# Patient Record
Sex: Male | Born: 1990 | Race: White | Hispanic: No | Marital: Single | Smoking: Former smoker
Health system: Southern US, Community
[De-identification: ages and names within clinical notes are randomized; demographics above are authoritative.]

## PROBLEM LIST (undated history)

## (undated) DIAGNOSIS — Z9189 Other specified personal risk factors, not elsewhere classified: Secondary | ICD-10-CM

## (undated) DIAGNOSIS — Q205 Discordant atrioventricular connection: Secondary | ICD-10-CM

## (undated) DIAGNOSIS — Z87891 Personal history of nicotine dependence: Secondary | ICD-10-CM

## (undated) DIAGNOSIS — Z789 Other specified health status: Secondary | ICD-10-CM

## (undated) DIAGNOSIS — Z7289 Other problems related to lifestyle: Secondary | ICD-10-CM

## (undated) DIAGNOSIS — F1221 Cannabis dependence, in remission: Secondary | ICD-10-CM

## (undated) HISTORY — DX: Other specified personal risk factors, not elsewhere classified: Z91.89

## (undated) HISTORY — DX: Cannabis dependence, in remission: F12.21

## (undated) HISTORY — DX: Discordant atrioventricular connection: Q20.5

## (undated) HISTORY — DX: Personal history of nicotine dependence: Z87.891

## (undated) HISTORY — DX: Other problems related to lifestyle: Z72.89

## (undated) HISTORY — DX: Other specified health status: Z78.9

## (undated) HISTORY — PX: TRANSPOSITION OF GREAT VESSELS REPAIR: SHX815

---
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
## 2012-01-03 HISTORY — PX: OTHER SURGICAL HISTORY: SHX169

## 2012-01-27 ENCOUNTER — Ambulatory Visit: Payer: Self-pay | Admitting: Family Medicine

## 2012-01-27 ENCOUNTER — Ambulatory Visit (INDEPENDENT_AMBULATORY_CARE_PROVIDER_SITE_OTHER): Payer: BC Managed Care – PPO | Admitting: Family Medicine

## 2012-01-27 ENCOUNTER — Encounter: Payer: Self-pay | Admitting: Family Medicine

## 2012-01-27 ENCOUNTER — Telehealth: Payer: Self-pay | Admitting: *Deleted

## 2012-01-27 VITALS — BP 118/70 | HR 64 | Temp 97.8°F | Wt 183.5 lb

## 2012-01-27 DIAGNOSIS — N509 Disorder of male genital organs, unspecified: Secondary | ICD-10-CM

## 2012-01-27 DIAGNOSIS — N5082 Scrotal pain: Secondary | ICD-10-CM | POA: Insufficient documentation

## 2012-01-27 LAB — POCT URINALYSIS DIPSTICK
Glucose, UA: NEGATIVE
Leukocytes, UA: NEGATIVE
Nitrite, UA: NEGATIVE
Spec Grav, UA: 1.02
Urobilinogen, UA: 0.2

## 2012-01-27 NOTE — Progress Notes (Signed)
Subjective:    [name], male    DOB: Mar 21, 1991, 21 y.o.   MRN: [id number]  HPI CC: testicle soreness  Danny Russo is 21 yo with h/o transposition of vessels.  1 1/2 mo h/o one testicle more tender than other.  Described as soreness ache.  Then recently noticed looks different.  Denies injury.    Currently sexually active.  1 partner in last year.  No STDs.  Last checked 05/2011.  No dysuria, urgency, frequency, discharge.  Wt Readings from Last 3 Encounters:  01/27/12 183 lb 8 oz (83.235 kg) (86.91%*)  07/02/11 153 lb 8 oz (69.627 kg) (59.50%*)  05/21/11 156 lb (70.761 kg) (63.99%*)   * Growth percentiles are based on CDC 2-20 Years data.   Working at [employer].  Has gained 30 lbs over winter.  Not as active as should be.  No chest pain, shortness of breath, leg swelling.    Quit smoking MJ and cigarettes.  Medications and allergies reviewed and updated in chart.  Past histories reviewed and updated if relevant as below. Patient Active Problem List  Diagnoses  . Healthcare maintenance  . Screening for venereal disease  . Snake bite  . Transposition of great arteries, corrected  . Scabies   Past Medical History  Diagnosis Date  . 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 Transposition of great arteries, corrected     infant, Dr. Elizebeth Brooking, no need for SBE ppx, 05/2011 last check WNL, rtc 2 yrs  . Marijuana abuse   . Smoking   . Alcohol use    Past Surgical History  Procedure Date  . Transposition of great vessels repair     infant  . Holter 06/2011    rare PAC, PVC  . US echocardiography 05/2011    normal bi vent size/fxn, normal flow across all valves, subsystemic R vent pressure, patent aortic arch, no pericardial effusion   History  Substance Use Topics  . Smoking status: Former Smoker -- 1.0 packs/day for 2 years    Types: Cigarettes    Quit date: 05/03/2011  . Smokeless tobacco: Never Used  . Alcohol Use:  Yes     Occasional, wine   Family History  Problem Relation Age of Onset  . Diabetes Maternal Grandmother   . Coronary artery disease Maternal Uncle 60  . Cancer Maternal Uncle     pancreatic cancer  . Stroke Neg Hx    No Known Allergies No current outpatient prescriptions on file prior to visit.   Review of Systems Per HPI    Objective:   Physical Exam  Nursing note and vitals reviewed. Constitutional: He appears well-developed and well-nourished. No distress.  HENT:  Head: Normocephalic and atraumatic.  Mouth/Throat: Oropharynx is clear and moist. No oropharyngeal exudate.  Eyes: Conjunctivae and EOM are normal. Pupils are equal, round, and reactive to light. No scleral icterus.  Cardiovascular: Normal rate, regular rhythm and intact distal pulses.   Murmur heard. Pulmonary/Chest: Effort normal and breath sounds normal. No respiratory distress. He has no wheezes. He has no rales.  Abdominal: Hernia confirmed negative in the right inguinal area and confirmed negative in the left inguinal area.  Genitourinary: Penis normal. Right testis shows no mass, no swelling and no tenderness. Right testis is descended. Left testis shows swelling and tenderness. Left testis shows no mass. Left testis is descended.       Left epididymis tender and swollen compared to right.  No mass on testicle appreciated. 3 resolving pustules on shaft of penis Shaves pubic hair.  Musculoskeletal: He exhibits no edema.  Lymphadenopathy:       Right: No inguinal adenopathy present.       Left: No inguinal adenopathy present.  Skin: Skin is warm and dry. No rash noted.  Psychiatric: He has a normal mood and affect.       Assessment & Plan:

## 2012-01-27 NOTE — Telephone Encounter (Signed)
Spoke with pt- discussed epididymal cyst o/w normal ultrasound.  To notify me if soreness not improving with NSAIDs for abx course (doxy).  O/w continue to monitor for now.

## 2012-01-27 NOTE — Telephone Encounter (Signed)
Angie from [facility] [place] called report:  Negative for torsion and otherwise normal/negative.  I advised we would call patient with results and that he was free to leave [facility].

## 2012-01-27 NOTE — Progress Notes (Signed)
Addended by: Josph Macho A on: 01/27/2012 09:17 AM   Modules accepted: Orders

## 2012-01-27 NOTE — Progress Notes (Signed)
Addended by: Eustaquio Boyden on: 01/27/2012 09:14 AM   Modules accepted: Orders

## 2012-01-27 NOTE — Patient Instructions (Signed)
We will call you with results of ultrasound at [phone]. I think you have inflammed epididymis on left side. We will see what ultrasound shows. Good to see you today, call us with questions. Work on getting more exercise - you have gained 30 lbs since last visit  Epididymitis Epididymitis is a swelling (inflammation) of the epididymis. The epididymis is a cord-like structure along the back part of the testicle. Epididymitis is usually, but not always, caused by infection. This is usually a sudden problem beginning with chills, fever and pain behind the scrotum and in the testicle. There may be swelling and redness of the testicle. DIAGNOSIS  Physical examination will reveal a tender, swollen epididymis. Sometimes, cultures are obtained from the urine or from prostate secretions to help find out if there is an infection or if the cause is a different problem. Sometimes, blood work is performed to see if your white blood cell count is elevated and if a germ (bacterial) or viral infection is present. Using this knowledge, an appropriate medicine which kills germs (antibiotic) can be chosen by your caregiver. A viral infection causing epididymitis will most often go away (resolve) without treatment. HOME CARE INSTRUCTIONS   Hot sitz baths for 20 minutes, 4 times per day, may help relieve pain.   Only take over-the-counter or prescription medicines for pain, discomfort or fever as directed by your caregiver.   Take all medicines, including antibiotics, as directed. Take the antibiotics for the full prescribed length of time even if you are feeling better.   It is very important to keep all follow-up appointments.  SEEK IMMEDIATE MEDICAL CARE IF:   You have a fever.   You have pain not relieved with medicines.   You have any worsening of your problems.   Your pain seems to come and go.   You develop pain, redness, and swelling in the scrotum and surrounding areas.  MAKE SURE YOU:    Understand these instructions.   Will watch your condition.   Will get help right away if you are not doing well or get worse.  Document Released: 11/15/2000 Document Revised: 07/31/2011 Document Reviewed: 10/05/2009 [facility] Patient Information 2012 [place], [place].

## 2012-01-27 NOTE — Assessment & Plan Note (Signed)
Anticipate epididymitis. Schedule [place] to r/o other cause and eval L epididymis. Anticipate congestive epididymitis. Treat with elevation of scrotum and NSAIDs. Consider abx - await [place] results. Declines STD testing, states monogamous relationship since last STD check 05/2011.

## 2012-01-28 LAB — URINE CULTURE
Colony Count: NO GROWTH
Organism ID, Bacteria: NO GROWTH

## 2012-03-31 ENCOUNTER — Encounter: Payer: Self-pay | Admitting: Family Medicine

## 2012-03-31 ENCOUNTER — Ambulatory Visit (INDEPENDENT_AMBULATORY_CARE_PROVIDER_SITE_OTHER): Payer: BC Managed Care – PPO | Admitting: Family Medicine

## 2012-03-31 ENCOUNTER — Encounter: Payer: Self-pay | Admitting: *Deleted

## 2012-03-31 VITALS — BP 102/60 | HR 74 | Temp 97.3°F | Wt 181.0 lb

## 2012-03-31 DIAGNOSIS — R0989 Other specified symptoms and signs involving the circulatory and respiratory systems: Secondary | ICD-10-CM

## 2012-03-31 DIAGNOSIS — R0600 Dyspnea, unspecified: Secondary | ICD-10-CM | POA: Insufficient documentation

## 2012-03-31 DIAGNOSIS — R0602 Shortness of breath: Secondary | ICD-10-CM | POA: Insufficient documentation

## 2012-03-31 MED ORDER — CETIRIZINE HCL 10 MG PO TABS: 10.0000 mg | ORAL_TABLET | Freq: Every day | ORAL | Status: DC

## 2012-03-31 NOTE — Patient Instructions (Signed)
I wonder if this is due to allergies - start claritin or zyrtec daily for next 1-2 weeks. If not improvement, let me know for course of albuterol. Back off nicotine. Update [place] if any worsening or cough, or fever or worsening pressure sensation or other concerns.

## 2012-03-31 NOTE — Assessment & Plan Note (Signed)
With allergic rhinitis sxs, also h/o using bronchodilator in past which helped. rec trial of antihistamine. To update me if sxs not improved with antihistamine, will start albuterol inhaler. If not improving, consider referral back to Dr. Elizebeth Brooking however doubt cardiac cause.

## 2012-03-31 NOTE — Progress Notes (Signed)
  Subjective:    [name], male    DOB: 07/26/1991, 21 y.o.   MRN: [id number]  HPI CC: trouble breathing  Danny Russo is pleasant 21 yo with h/o transposition of great arteries corrected as infant who presents with several day history of pressure in chest as well as trouble taking deep breath.  Otherwise no dyspnea.  Has been present for last 1 1/2 wks, but becoming more noticeable over last few days.  No diaphoresis or nausea.  No palpitations.  No dizziness, lightheadedness.  Recently increased stress from job.  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  Similar sxs last year, wonders if due to allergies.  ++ sneezing increased recently.  Mild RN and PNdrainage.  No congestion, HA, watery itchy eyes, no coughing, wheezing.  No h/o asthma.  Tried benadryl last night which may have helped sxs.  Started dipping more recently (over last 1-2 wks) and occasionally drinks EtOH.  No rec drugs, specifically no MJ.  No cigarettes.  Wt Readings from Last 3 Encounters:  03/31/12 181 lb (82.101 kg) (84.85%*)  01/27/12 183 lb 8 oz (83.235 kg) (86.91%*)  07/02/11 153 lb 8 oz (69.627 kg) (59.50%*)   * Growth percentiles are based on CDC 2-20 Years data.    Past Medical History  Diagnosis Date  . 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 Transposition of great arteries, corrected     infant, Dr. Elizebeth Brooking, no need for SBE ppx, 05/2011 last check WNL, rtc 2 yrs  . Marijuana smoker in remission   . History of smoking   . Alcohol use     Review of Systems Per HPI    Objective:   Physical Exam  Nursing note and vitals reviewed. Constitutional: He appears well-developed and well-nourished. No distress.  HENT:  Head: Normocephalic and atraumatic.  Right Ear: Hearing, tympanic membrane, external ear and ear canal normal.  Left Ear: Hearing, tympanic  membrane, external ear and ear canal normal.  Nose: Mucosal edema present. No rhinorrhea. Right sinus exhibits no maxillary sinus tenderness and no frontal sinus tenderness. Left sinus exhibits no maxillary sinus tenderness and no frontal sinus tenderness.  Mouth/Throat: Uvula is midline and mucous membranes are normal. Posterior oropharyngeal erythema present. No oropharyngeal exudate, posterior oropharyngeal edema or tonsillar abscesses.       Oropharyngeal cobblestoning  Eyes: Conjunctivae and EOM are normal. Pupils are equal, round, and reactive to light. No scleral icterus.  Neck: Normal range of motion. Neck supple.  Cardiovascular: Normal rate, regular rhythm and intact distal pulses.   Murmur (4/6 SEM) heard. Pulmonary/Chest: Effort normal. No respiratory distress. He has no wheezes. He has no rales.       Slight rhonchi left lobe but good air movement  Lymphadenopathy:    He has no cervical adenopathy.  Skin: Skin is warm and dry. No rash noted.       Assessment & Plan:

## 2012-04-01 ENCOUNTER — Ambulatory Visit: Payer: BC Managed Care – PPO | Admitting: Family Medicine

## 2012-05-06 ENCOUNTER — Telehealth: Payer: Self-pay | Admitting: Family Medicine

## 2012-05-06 NOTE — Telephone Encounter (Signed)
Caller: Danny Russo/Patient; PCP: Eustaquio Boyden; CB#: [phone];  Call regarding Rash/Hives;  [name] developed a rash on his ankles on 04/28/12.  Has now spread to the back of his knees, bend of arms, and shoulder .  Itching.  Rash is composed of small bumps, light pink.  Afebrile.  Utilized Rash - Widespread and Cause Unknown Guideline.  See PCP within 72 hrs disposition.  Appt scheduled for 05/07/12 @ 1345 with Dr. Alphonsus Sias.  Parameters reviewed concerning when to call back.

## 2012-05-06 NOTE — Telephone Encounter (Signed)
Noted. Thanks.

## 2012-05-07 ENCOUNTER — Encounter: Payer: Self-pay | Admitting: Internal Medicine

## 2012-05-07 ENCOUNTER — Ambulatory Visit (INDEPENDENT_AMBULATORY_CARE_PROVIDER_SITE_OTHER): Payer: BC Managed Care – PPO | Admitting: Internal Medicine

## 2012-05-07 VITALS — BP 112/70 | HR 75 | Temp 98.0°F | Wt 175.0 lb

## 2012-05-07 DIAGNOSIS — L255 Unspecified contact dermatitis due to plants, except food: Secondary | ICD-10-CM | POA: Insufficient documentation

## 2012-05-07 MED ORDER — TRIAMCINOLONE ACETONIDE 0.1 % EX LOTN: TOPICAL_LOTION | Freq: Three times a day (TID) | CUTANEOUS | Status: DC | PRN

## 2012-05-07 NOTE — Progress Notes (Signed)
  Subjective:    [name], male    DOB: 1991-06-19, 21 y.o.   MRN: [id number]  HPI Scattered rash--both antecubital fossae, slight on neck, ankles and both popliteal fossae Noticed it starting about a week ago Got worse for a while and has persisted  Was out doing yard work---but thinks it may have started before he could have been exposed Has not tried any Rx  Very itchy  No current outpatient prescriptions on file prior to visit.    No Known Allergies  Past Medical History  Diagnosis Date  . 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 Transposition of great arteries, corrected     infant, Dr. Elizebeth Brooking, no need for SBE ppx, 05/2011 last check WNL, rtc 2 yrs  . Marijuana smoker in remission   . History of smoking   . Alcohol use     Past Surgical History  Procedure Date  . Transposition of great vessels repair     infant  . Holter 06/2011    rare PAC, PVC  . US echocardiography 05/2011    normal bi vent size/fxn, normal flow across all valves, subsystemic R vent pressure, patent aortic arch, no pericardial effusion  . Scrotal US 01/2012    1.3cm epididymal cyst on left, o/w normal    Family History  Problem Relation Age of Onset  . Diabetes Maternal Grandmother   . Coronary artery disease Maternal Uncle 60  . Cancer Maternal Uncle     pancreatic cancer  . Stroke Neg Hx     History   Social History  . Marital Status: Single    Spouse Name: N/A    Number of Children: 0  . Years of Education: HS   Occupational History  . Not on file.   Social History Main Topics  . Smoking status: Former Smoker -- 1.0 packs/day for 2 years    Types: Cigarettes    Quit date: 05/03/2011  . Smokeless tobacco: Current User    Types: Snuff  . Alcohol Use: Yes     Occasional, wine  . Drug Use: No     MJ use in the past-heavy smoker, recently quit  . Sexually Active: Not on file   Other Topics Concern  . Not on file    Social History Narrative   Lives with grandparents and great grandmother, 1 dog.Mother lives in [place], Father lives in [place], stays in touch with them.Neighbor with Annabelle Harman, [employer]: HS graduateActivity: trying to stay active.   Review of Systems No history of eczema No recent illness    Objective:   Physical Exam  Constitutional: He appears well-developed and well-nourished. No distress.  Skin:       Scattered erythematous papular and excoriated lesions on ankles, inside of elbows and knees and slightly on neck          Assessment & Plan:

## 2012-05-07 NOTE — Patient Instructions (Signed)
Please use the cetirizine daily to help the itching If you are worse tomorrow, call for prednisone prescription

## 2012-05-07 NOTE — Assessment & Plan Note (Signed)
Fairly mild Will try steroid lotion Consider systemic steroids if worsens

## 2012-06-19 ENCOUNTER — Encounter: Payer: Self-pay | Admitting: Family Medicine

## 2012-06-19 ENCOUNTER — Ambulatory Visit (INDEPENDENT_AMBULATORY_CARE_PROVIDER_SITE_OTHER): Payer: BC Managed Care – PPO | Admitting: Family Medicine

## 2012-06-19 VITALS — BP 110/78 | HR 84 | Temp 98.4°F | Wt 172.0 lb

## 2012-06-19 DIAGNOSIS — R21 Rash and other nonspecific skin eruption: Secondary | ICD-10-CM

## 2012-06-19 MED ORDER — PERMETHRIN 5 % EX CREA: TOPICAL_CREAM | Freq: Once | CUTANEOUS | Status: AC

## 2012-06-19 MED ORDER — PERMETHRIN 5 % EX CREA: TOPICAL_CREAM | Freq: Once | CUTANEOUS | Status: DC

## 2012-06-19 NOTE — Assessment & Plan Note (Signed)
Story and distribution consistent with scabies - will treat as such with 5% permethrin. Update me if not improved. Discussed washing all bedding.

## 2012-06-19 NOTE — Patient Instructions (Addendum)
permethrin 1% is over the counter. I've sent in permethrin 5%, price both out. I do think this is scabies though. Place on all of body at night, except head and face, then sleep with it, then wash off next day. Good to see you today, call us with questions.  Scabies Scabies are small bugs (mites) that burrow under the skin and cause red bumps and severe itching. These bugs can only be seen with a microscope. Scabies are highly contagious. They can spread easily from person to person by direct contact. They are also spread through sharing clothing or linens that have the scabies mites living in them. It is not unusual for an entire family to become infected through shared towels, clothing, or bedding.  HOME CARE INSTRUCTIONS   Your caregiver may prescribe a cream or lotion to kill the mites. If this cream is prescribed; massage the cream into the entire area of the body from the neck to the bottom of both feet. Also massage the cream into the scalp and face if your child is less than 40 year old. Avoid the eyes and mouth.   Leave the cream on for 8 to12 hours. Do not wash your hands after application. Your child should bathe or shower after the 8 to 12 hour application period. Sometimes it is helpful to apply the cream to your child at right before bedtime.   One treatment is usually effective and will eliminate approximately 95% of infestations. For severe cases, your caregiver may decide to repeat the treatment in 1 week. Everyone in your household should be treated with one application of the cream.   New rashes or burrows should not appear after successful treatment within 24 to 48 hours; however the itching and rash may last for 2 to 4 weeks after successful treatment. If your symptoms persist longer than this, see your caregiver.   Your caregiver also may prescribe a medication to help with the itching or to help the rash go away more quickly.   Scabies can live on clothing or linens for up  to 3 days. Your entire child's recently used clothing, towels, stuffed toys, and bed linens should be washed in hot water and then dried in a dryer for at least 20 minutes on high heat. Items that cannot be washed should be enclosed in a plastic bag for at least 3 days.   To help relieve itching, bathe your child in a cool bath or apply cool washcloths to the affected areas.   Your child may return to school after treatment with the prescribed cream.  SEEK MEDICAL CARE IF:   The itching persists longer than 4 weeks after treatment.   The rash spreads or becomes infected (the area has red blisters or yellow-tan crust).  Document Released: 11/18/2005 Document Revised: 11/07/2011 Document Reviewed: 03/29/2009 [facility] Patient Information 2012 [place], [place].

## 2012-06-19 NOTE — Progress Notes (Signed)
  Subjective:    [name], male    DOB: [birth year], 21 y.o.   MRN: [id number]  HPI CC: skin rash  Noticing rash on hands and legs for last few days.  Has been exposed to mosquitoes recently - has been at pool.  Very itchy.  No other contacts with rash recently.  None on face, trunk or back.  No fevers/chills, oral lesions, lip swelling.  Nausea/vomiting, joint pains.  Wt Readings from Last 3 Encounters:  06/19/12 172 lb (78.019 kg) (76.79%*)  05/07/12 175 lb (79.379 kg) (79.97%*)  03/31/12 181 lb (82.101 kg) (84.85%*)   * Growth percentiles are based on CDC 2-20 Years data.    Review of Systems Per HPI    Objective:   Physical Exam WDWN CM NAD Arms and legs with excoriated pruritic papules.  Very pruritic.  Especially on interdigital spaces, some on dorsal wrists    Assessment & Plan:

## 2012-11-13 ENCOUNTER — Emergency Department (HOSPITAL_COMMUNITY): Payer: BC Managed Care – PPO

## 2012-11-13 ENCOUNTER — Encounter (HOSPITAL_COMMUNITY): Payer: Self-pay | Admitting: Emergency Medicine

## 2012-11-13 ENCOUNTER — Emergency Department (HOSPITAL_COMMUNITY)
Admission: EM | Admit: 2012-11-13 | Discharge: 2012-11-14 | Disposition: A | Discharge status: Home / Self Care | Payer: BC Managed Care – PPO | Attending: Emergency Medicine | Admitting: Emergency Medicine

## 2012-11-13 DIAGNOSIS — Z79899 Other long term (current) drug therapy: Secondary | ICD-10-CM | POA: Insufficient documentation

## 2012-11-13 DIAGNOSIS — Y9389 Activity, other specified: Secondary | ICD-10-CM | POA: Insufficient documentation

## 2012-11-13 DIAGNOSIS — Q205 Discordant atrioventricular connection: Secondary | ICD-10-CM | POA: Insufficient documentation

## 2012-11-13 DIAGNOSIS — F17200 Nicotine dependence, unspecified, uncomplicated: Secondary | ICD-10-CM | POA: Insufficient documentation

## 2012-11-13 DIAGNOSIS — R0981 Nasal congestion: Secondary | ICD-10-CM

## 2012-11-13 DIAGNOSIS — J3489 Other specified disorders of nose and nasal sinuses: Secondary | ICD-10-CM | POA: Insufficient documentation

## 2012-11-13 DIAGNOSIS — X503XXA Overexertion from repetitive movements, initial encounter: Secondary | ICD-10-CM | POA: Insufficient documentation

## 2012-11-13 DIAGNOSIS — R0789 Other chest pain: Secondary | ICD-10-CM

## 2012-11-13 DIAGNOSIS — F1211 Cannabis abuse, in remission: Secondary | ICD-10-CM | POA: Insufficient documentation

## 2012-11-13 DIAGNOSIS — R0982 Postnasal drip: Secondary | ICD-10-CM | POA: Insufficient documentation

## 2012-11-13 DIAGNOSIS — Y929 Unspecified place or not applicable: Secondary | ICD-10-CM | POA: Insufficient documentation

## 2012-11-13 DIAGNOSIS — S298XXA Other specified injuries of thorax, initial encounter: Secondary | ICD-10-CM | POA: Insufficient documentation

## 2012-11-13 LAB — CBC
MCH: 30.1 pg (ref 26.0–34.0)
MCV: 83.3 fL (ref 78.0–100.0)
Platelets: 248 10*3/uL (ref 150–400)
RBC: 5.15 MIL/uL (ref 4.22–5.81)

## 2012-11-13 LAB — BASIC METABOLIC PANEL
CO2: 22 mEq/L (ref 19–32)
Calcium: 9.6 mg/dL (ref 8.4–10.5)
Creatinine, Ser: 0.85 mg/dL (ref 0.50–1.35)
Glucose, Bld: 94 mg/dL (ref 70–99)

## 2012-11-13 LAB — PRO B NATRIURETIC PEPTIDE: Pro B Natriuretic peptide (BNP): 231.9 pg/mL — ABNORMAL HIGH (ref 0–125)

## 2012-11-13 NOTE — ED Notes (Addendum)
C/o sharp L sided chest pain worse with deep inspiration and sob since 9pm.  Productive cough with clear and yellow phlegm x 2 weeks.  NAD at this time. Denies nausea/vomiting. Reports smoked marijuana and drank etoh tonight.

## 2012-11-14 MED ORDER — OXYMETAZOLINE HCL 0.05 % NA SOLN
1.0000 | Freq: Once | NASAL | Status: AC
  Administered 2012-11-14: 1 via NASAL
  Filled 2012-11-14: qty 15

## 2012-11-14 MED ORDER — METHOCARBAMOL 500 MG PO TABS: 500.0000 mg | ORAL_TABLET | Freq: Two times a day (BID) | ORAL | Status: DC

## 2012-11-14 NOTE — ED Notes (Signed)
GD Chest x-ray completed; results back at 2357.

## 2012-11-14 NOTE — ED Provider Notes (Signed)
History     CSN: [id number]  Arrival date & time 11/13/12  2123   First MD Initiated Contact with Patient 11/13/12 2354      Chief Complaint  Patient presents with  . Chest Pain    (Consider location/radiation/quality/duration/timing/severity/associated sxs/prior treatment) The history is provided by the patient and medical records.    Danny Russo is a 21 y.o. male  with a hx of polysubstance abuse presents to the Emergency Department complaining of gradual, persistent, progressively worsening chest pain onset this evening. Pt spent the day lifting heavy equipment which is more strenuous than usual for him.  He states the pain began about 2 hours after he finished working.  He states that he could feel his muscles strain as he was doing the lifting. Associated symptoms include cough x 2 weeks with clear phlegm.  Pt smokes cigarettes and mariajuana today.  Pt also drank 6 oz of beer today as well .  Nothing makes it better and movement, coughing makes it worse.  Pt denies fever, chills, headache, shortness of breath, abdominal pain, nausea, vomiting, diarrhea, dysuria, syncope, weakness, dizziness.  Pt describes the pain sharp, intermittent, waxing and waning but never resolving; rated at a 1/10 right now, but was a 4/10 earlier, nonradiating and located at the L nipple.  Pt states everytime he inhaled deeply he felt a pain.  Patient also endorses approximately 5 days worth of rhinorrhea, nasal congestion and sinus pressure.  He is treated this successfully with over-the-counter decongestants.    Past Medical History  Diagnosis Date  . 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 Transposition of great arteries, corrected     infant, Dr. Elizebeth Brooking, no need for SBE ppx, 05/2011 last check WNL, rtc 2 yrs  . Marijuana smoker in remission   . History of smoking   . Alcohol use     Past Surgical History  Procedure Date  . Transposition of great  vessels repair     infant  . Holter 06/2011    rare PAC, PVC  . US echocardiography 05/2011    normal bi vent size/fxn, normal flow across all valves, subsystemic R vent pressure, patent aortic arch, no pericardial effusion  . Scrotal US 01/2012    1.3cm epididymal cyst on left, o/w normal    Family History  Problem Relation Age of Onset  . Diabetes Maternal Grandmother   . Coronary artery disease Maternal Uncle 60  . Cancer Maternal Uncle     pancreatic cancer  . Stroke Neg Hx     History  Substance Use Topics  . Smoking status: Current Every Day Smoker -- 1.0 packs/day for 2 years    Types: Cigarettes  . Smokeless tobacco: Current User    Types: Snuff  . Alcohol Use: Yes      Review of Systems  Constitutional: Negative for fever, diaphoresis, appetite change, fatigue and unexpected weight change.  HENT: Positive for congestion, rhinorrhea, postnasal drip and sinus pressure. Negative for mouth sores and neck stiffness.   Eyes: Negative for visual disturbance.  Respiratory: Negative for cough, chest tightness, shortness of breath and wheezing.   Cardiovascular: Positive for chest pain.  Gastrointestinal: Negative for nausea, vomiting, abdominal pain, diarrhea and constipation.  Genitourinary: Negative for dysuria, urgency, frequency and hematuria.  Skin: Negative for rash.  Neurological: Negative for syncope, light-headedness and headaches.  Psychiatric/Behavioral: Negative for sleep disturbance. The patient is not nervous/anxious.   All other  systems reviewed and are negative.    Allergies  Review of patient's allergies indicates no known allergies.  Home Medications   Current Outpatient Rx  Name  Route  Sig  Dispense  Refill  . FLUTICASONE PROPIONATE 50 MCG/ACT NA SUSP   Nasal   Place 2 sprays into the nose daily as needed. For nasal congestion         . METHOCARBAMOL 500 MG PO TABS   Oral   Take 1 tablet (500 mg total) by mouth 2 (two) times daily.   20  tablet   0     BP 109/57  Pulse 69  Temp 98.2 F (36.8 C) (Oral)  Resp 16  SpO2 100%  Physical Exam  Nursing note and vitals reviewed. Constitutional: He is oriented to person, place, and time. He appears well-developed and well-nourished. No distress.  HENT:  Head: Normocephalic and atraumatic.  Right Ear: Tympanic membrane, external ear and ear canal normal.  Left Ear: Tympanic membrane, external ear and ear canal normal.  Nose: Nose normal. No mucosal edema or rhinorrhea. Right sinus exhibits no maxillary sinus tenderness and no frontal sinus tenderness. Left sinus exhibits no maxillary sinus tenderness and no frontal sinus tenderness.  Mouth/Throat: Uvula is midline, oropharynx is clear and moist and mucous membranes are normal. No oropharyngeal exudate.  Eyes: Conjunctivae normal are normal. Pupils are equal, round, and reactive to light. No scleral icterus.  Neck: Normal range of motion. Neck supple.  Cardiovascular: Normal rate, regular rhythm and intact distal pulses.  Exam reveals no gallop and no friction rub.   Murmur heard.  Systolic murmur is present with a grade of 4/6   Diastolic murmur is present with a grade of 4/6  Pulses:      Radial pulses are 2+ on the right side, and 2+ on the left side.       Dorsalis pedis pulses are 2+ on the right side, and 2+ on the left side.       Posterior tibial pulses are 2+ on the right side, and 2+ on the left side.       Hx of great vessel transposition repair  Pulmonary/Chest: Effort normal and breath sounds normal. No respiratory distress. He has no decreased breath sounds. He has no wheezes. He has no rhonchi. He has no rales. He exhibits tenderness (to palpation over the L chest).    Abdominal: Soft. Bowel sounds are normal. He exhibits no distension and no mass. There is no tenderness. There is no rigidity, no rebound, no guarding, no tenderness at McBurney's point and negative Murphy's sign.  Musculoskeletal: Normal range  of motion. He exhibits no edema and no tenderness.  Lymphadenopathy:    He has no cervical adenopathy.  Neurological: He is alert and oriented to person, place, and time. He exhibits normal muscle tone. Coordination normal.       Speech is clear and goal oriented Moves extremities without ataxia  Skin: Skin is warm and dry. No rash noted. He is not diaphoretic. No erythema.  Psychiatric: He has a normal mood and affect.    ED Course  Procedures (including critical care time)  Labs Reviewed  CBC - Abnormal; Notable for the following:    MCHC 36.1 (*)     All other components within normal limits  PRO B NATRIURETIC PEPTIDE - Abnormal; Notable for the following:    Pro B Natriuretic peptide (BNP) 231.9 (*)     All other components within normal limits  BASIC METABOLIC PANEL   Dg Chest 2 View  11/13/2012  *RADIOLOGY REPORT*  Clinical Data: Chest pain, congestion, and cough.  CHEST - 2 VIEW  Comparison: 05/08/2009  Findings: The heart size and pulmonary vascularity are normal. The lungs appear clear and expanded without focal air space disease or consolidation. No blunting of the costophrenic angles.  Scattered calcified granulomas in the lungs with right hilar calcified lymph nodes.  No pneumothorax.  Mediastinal contours appear intact. Surgical clips in the anterior mediastinum.  No significant change since previous study.  IMPRESSION: No evidence of active pulmonary disease.   Original Report Authenticated By: Burman Nieves, M.D.    ECG  Date: 11/14/2012  Rate: 87  Rhythm: normal sinus rhythm  QRS Axis: right  Intervals: normal  ST/T Wave abnormalities: normal  Conduction Disutrbances:none  Narrative Interpretation: incomplete R bundle branch block  Old EKG Reviewed: none available    1. Chest wall pain   2. Sinus congestion   3. Transposition of great arteries, corrected       MDM  Danny Russo presents with chest wall pain after lifting heavy objects throughout the  day.  Patient with a history of transposition just repaired as an infant. ECG unremarkable, BMP and CBC unremarkable.  BMP 231.9 (unknown pt's baseline).  Pain is worse with movement and deep inspiration.  No concern for ACS, pneumothorax, pneumonia.  Patient is to be discharged with recommendation to follow up with PCP in regards to today's hospital visit. Chest pain is not likely of cardiac or pulmonary etiology d/t presentation, perc negative, VSS, no tracheal deviation, no JVD or new murmur, RRR, breath sounds equal bilaterally, EKG without acute abnormalities, and negative CXR. Pt has been advised to return to the ED if CP becomes exertional, associated with diaphoresis or nausea, radiates to left jaw/arm, worsens or becomes concerning in any way. Will discharge home with muscle relaxer.    Dahlia Client Shelley Pooley, PA-C 11/14/12 0124

## 2012-11-14 NOTE — ED Provider Notes (Signed)
Medical screening examination/treatment/procedure(s) were performed by non-physician practitioner and as supervising physician I was immediately available for consultation/collaboration.    Vida Roller, MD 11/14/12 1630

## 2013-05-20 ENCOUNTER — Encounter: Payer: Self-pay | Admitting: Family Medicine

## 2013-05-20 ENCOUNTER — Ambulatory Visit (INDEPENDENT_AMBULATORY_CARE_PROVIDER_SITE_OTHER): Payer: BC Managed Care – PPO | Admitting: Family Medicine

## 2013-05-20 VITALS — BP 118/76 | HR 84 | Temp 98.5°F | Wt 163.5 lb

## 2013-05-20 DIAGNOSIS — Z2839 Other underimmunization status: Secondary | ICD-10-CM | POA: Insufficient documentation

## 2013-05-20 DIAGNOSIS — R0600 Dyspnea, unspecified: Secondary | ICD-10-CM

## 2013-05-20 DIAGNOSIS — Z9189 Other specified personal risk factors, not elsewhere classified: Secondary | ICD-10-CM

## 2013-05-20 DIAGNOSIS — R0609 Other forms of dyspnea: Secondary | ICD-10-CM

## 2013-05-20 NOTE — Assessment & Plan Note (Signed)
Check varicella titers. PPD to be placed tomorrow. UTD on other required vaccines.

## 2013-05-20 NOTE — Progress Notes (Signed)
  Subjective:    [name], male    DOB: 10/21/1991, 22 y.o.   MRN: [id number]  HPI CC: dyspnea, requests vaccines for school  Notes some right sided lung tightness with deep breaths.  Noticed worsening sxs last week.  occasional palpitations.  No chest pain/tightness. No wheezing.  Mild cough with mucous.  Some nasal congestion.  No rhinorrhea or sneezing, watery eyes.  Has been using mucinex which seemed to help after a few days. Just bought claritin.  Wonders if needs inhaler for breathing. In past thought allergy related - started on zyrtec and improved. No h/o anxiety.  Smoking - 1/2 ppd. MJ - daily. EtOH - 3-4 drinks in 1 sitting.  Vaccines - needs Hep B, MMR, Varicella, Tdap, flu, TB test. 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  Wt Readings from Last 3 Encounters:  05/20/13 163 lb 8 oz (74.163 kg) (63%*, Z = 0.33)  06/19/12 172 lb (78.019 kg) (77%*, Z = 0.73)  05/07/12 175 lb (79.379 kg) (80%*, Z = 0.84)   * Growth percentiles are based on CDC 2-20 Years data.    Past Medical History  Diagnosis Date  . 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 Transposition of great arteries, corrected     infant, Dr. Elizebeth Brooking, no need for SBE ppx, 05/2011 last check WNL, rtc 2 yrs  . Marijuana smoker in remission   . History of smoking   . Alcohol use     Review of Systems Per HPI    Objective:   Physical Exam  Nursing note and vitals reviewed. Constitutional: He appears well-developed and well-nourished. No distress.  HENT:  Head: Normocephalic and atraumatic.  Nose: Nose normal. No mucosal edema or rhinorrhea.  Mouth/Throat: Uvula is midline, oropharynx is clear and moist and mucous membranes are normal. No oropharyngeal exudate, posterior oropharyngeal edema, posterior oropharyngeal erythema or tonsillar abscesses.  Eyes: Conjunctivae and EOM are normal. Pupils are equal, round, and reactive to light. No scleral icterus.  Neck:  Normal range of motion. Neck supple.  Cardiovascular: Normal rate, regular rhythm and intact distal pulses.   Murmur (holosystolic 4/6 murmur heard throughout precordium) heard. Pulmonary/Chest: Effort normal and breath sounds normal. No respiratory distress. He has no wheezes. He has no rales.  Lymphadenopathy:    He has no cervical adenopathy.  Skin: Skin is warm and dry. No rash noted.       Assessment & Plan:

## 2013-05-20 NOTE — Patient Instructions (Addendum)
Return for PPD for TB test. Varicella titers today. Cut back on smoking and marijuana. Ok to use claritin and plain mucinex or immediate release guaifenesin (no pseudophed).

## 2013-05-20 NOTE — Assessment & Plan Note (Signed)
Anticipate related to smoking and MJ use - discussed this and advised him he needs to quit both. May try claritin and continue mucinex for possible allergic rhinitis component. Lungs clear today.  No wheezing today - no need for albuterol.

## 2013-05-27 ENCOUNTER — Ambulatory Visit (INDEPENDENT_AMBULATORY_CARE_PROVIDER_SITE_OTHER): Payer: BC Managed Care – PPO | Admitting: *Deleted

## 2013-05-27 DIAGNOSIS — Z23 Encounter for immunization: Secondary | ICD-10-CM

## 2013-06-23 ENCOUNTER — Ambulatory Visit (INDEPENDENT_AMBULATORY_CARE_PROVIDER_SITE_OTHER): Payer: BC Managed Care – PPO | Admitting: *Deleted

## 2013-06-23 ENCOUNTER — Ambulatory Visit: Payer: BC Managed Care – PPO

## 2013-06-23 ENCOUNTER — Telehealth: Payer: Self-pay | Admitting: Family Medicine

## 2013-06-23 DIAGNOSIS — Z0279 Encounter for issue of other medical certificate: Secondary | ICD-10-CM

## 2013-06-23 NOTE — Telephone Encounter (Signed)
Needs to come in today to receive PPD if needs to be read by Friday.  plz schedule nurse visit.

## 2013-06-23 NOTE — Telephone Encounter (Signed)
I already spoke to [facility] about this and he has been placed on the nurse schedule for today. He was supposed to come back the day after his last OV with you and didn't.

## 2013-06-23 NOTE — Telephone Encounter (Signed)
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 He says he needs the info turned in by Friday.  Is it ok to schedule this? Thank you.

## 2013-06-23 NOTE — Telephone Encounter (Signed)
Pt says he needs a PPD skin test for college.  He says he needs all his info turned in by Friday.  Is it ok to schedule the test? Also, I accidentally closed the previous encounter before sending, I'm just letting you know in the event you see a duplicate. Thank you.

## 2013-06-24 ENCOUNTER — Ambulatory Visit (INDEPENDENT_AMBULATORY_CARE_PROVIDER_SITE_OTHER): Payer: BC Managed Care – PPO | Admitting: Family Medicine

## 2013-06-24 ENCOUNTER — Encounter: Payer: Self-pay | Admitting: Family Medicine

## 2013-06-24 VITALS — BP 118/76 | HR 80 | Temp 98.7°F | Wt 174.0 lb

## 2013-06-24 DIAGNOSIS — T1590XA Foreign body on external eye, part unspecified, unspecified eye, initial encounter: Secondary | ICD-10-CM

## 2013-06-24 DIAGNOSIS — T1591XA Foreign body on external eye, part unspecified, right eye, initial encounter: Secondary | ICD-10-CM

## 2013-06-24 MED ORDER — POLYMYXIN B-TRIMETHOPRIM 10000-0.1 UNIT/ML-% OP SOLN: 1.0000 [drp] | OPHTHALMIC | Status: DC

## 2013-06-24 MED ORDER — METHOCARBAMOL 500 MG PO TABS: 500.0000 mg | ORAL_TABLET | Freq: Two times a day (BID) | ORAL | Status: DC

## 2013-06-24 NOTE — Assessment & Plan Note (Signed)
Fully removed with cotton swab.  treat preventatively with polytrim eye drops and lubricating eye drops Viewed object under magnification - ?piece of pollen.

## 2013-06-24 NOTE — Patient Instructions (Signed)
Looks like piece of pollen was in eye. Use polytrim eye drops for next 5 days - 3-4 times daily Use lubricating eye drops in eye. Let us know if not improving with this. Good to see you, call us with questions

## 2013-06-24 NOTE — Progress Notes (Signed)
  Subjective:    [name], male    DOB: [birth year], 22 y.o.   MRN: [id number]  HPI CC: R eye irritation  Woke up this morning with irritation in R eye.  Thinks something may be in eye. Denies inciting trauma/injury, doesn't remember getting anything in R eye yesterday.  No vision changes.  Past Medical History  Diagnosis Date  . 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 Transposition of great arteries, corrected     infant, Dr. Elizebeth Brooking, no need for SBE ppx, 05/2011 last check WNL, rtc 2 yrs  . Marijuana smoker in remission   . History of smoking   . Alcohol use      Review of Systems Per HPI    Objective:   Physical Exam  Nursing note and vitals reviewed. Constitutional: He appears well-developed and well-nourished. No distress.  Eyes: EOM are normal. Pupils are equal, round, and reactive to light. Right eye exhibits no discharge, no exudate and no hordeolum. Foreign body present in the right eye. Left eye exhibits no discharge, no exudate and no hordeolum. No foreign body present in the left eye. Right conjunctiva is injected. Left conjunctiva is not injected. No scleral icterus.    Brown small foreign object on lateral edge of iris.  Mild surrounding bulbar conjunctival injection Removed with cotton swab, no evidence of residual foreign object.         Assessment & Plan:

## 2013-06-25 LAB — TB SKIN TEST: TB Skin Test: NEGATIVE

## 2013-07-17 ENCOUNTER — Emergency Department (HOSPITAL_COMMUNITY): Payer: Self-pay

## 2013-07-17 ENCOUNTER — Encounter (HOSPITAL_COMMUNITY): Payer: Self-pay | Admitting: Emergency Medicine

## 2013-07-17 ENCOUNTER — Emergency Department (HOSPITAL_COMMUNITY)
Admission: EM | Admit: 2013-07-17 | Discharge: 2013-07-17 | Disposition: A | Discharge status: Home / Self Care | Payer: Self-pay | Attending: Emergency Medicine | Admitting: Emergency Medicine

## 2013-07-17 DIAGNOSIS — F17200 Nicotine dependence, unspecified, uncomplicated: Secondary | ICD-10-CM | POA: Insufficient documentation

## 2013-07-17 DIAGNOSIS — S99929A Unspecified injury of unspecified foot, initial encounter: Secondary | ICD-10-CM | POA: Insufficient documentation

## 2013-07-17 DIAGNOSIS — Z79899 Other long term (current) drug therapy: Secondary | ICD-10-CM | POA: Insufficient documentation

## 2013-07-17 DIAGNOSIS — Y9389 Activity, other specified: Secondary | ICD-10-CM | POA: Insufficient documentation

## 2013-07-17 DIAGNOSIS — Z9189 Other specified personal risk factors, not elsewhere classified: Secondary | ICD-10-CM | POA: Insufficient documentation

## 2013-07-17 DIAGNOSIS — Z8774 Personal history of (corrected) congenital malformations of heart and circulatory system: Secondary | ICD-10-CM | POA: Insufficient documentation

## 2013-07-17 DIAGNOSIS — M25572 Pain in left ankle and joints of left foot: Secondary | ICD-10-CM

## 2013-07-17 DIAGNOSIS — X58XXXA Exposure to other specified factors, initial encounter: Secondary | ICD-10-CM | POA: Insufficient documentation

## 2013-07-17 DIAGNOSIS — S8990XA Unspecified injury of unspecified lower leg, initial encounter: Secondary | ICD-10-CM | POA: Insufficient documentation

## 2013-07-17 DIAGNOSIS — Y92009 Unspecified place in unspecified non-institutional (private) residence as the place of occurrence of the external cause: Secondary | ICD-10-CM | POA: Insufficient documentation

## 2013-07-17 DIAGNOSIS — Z8659 Personal history of other mental and behavioral disorders: Secondary | ICD-10-CM | POA: Insufficient documentation

## 2013-07-17 DIAGNOSIS — S9000XA Contusion of unspecified ankle, initial encounter: Secondary | ICD-10-CM | POA: Insufficient documentation

## 2013-07-17 MED ORDER — OXYCODONE-ACETAMINOPHEN 5-325 MG PO TABS
1.0000 | ORAL_TABLET | Freq: Once | ORAL | Status: AC
  Administered 2013-07-17: 1 via ORAL
  Filled 2013-07-17: qty 1

## 2013-07-17 NOTE — ED Notes (Signed)
Pt alert, arrives from home, c/o left ankle pain, onset this am while "sword fighting". resp even unlabored, skin pwd

## 2013-07-17 NOTE — ED Provider Notes (Signed)
CSN: [id number]     Arrival date & time 07/17/13  1356 History     First MD Initiated Contact with Patient 07/17/13 1438     Chief Complaint  Patient presents with  . Ankle Pain    Left   (Consider location/radiation/quality/duration/timing/severity/associated sxs/prior Treatment) The history is provided by the patient and medical records.   Patient presents to the ED for left ankle pain. Patient states he was sort fighting with friends when he rolled his left ankle laterally.  Denies head trauma or LOC.  Has had persistent, throbbing pain since injury. No prior left ankle or foot injury. Denies any numbness or paresthesias of foot or toes. No meds taken prior to arrival.  Past Medical History  Diagnosis Date  . 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 Transposition of great arteries, corrected     infant, Dr. Elizebeth Brooking, no need for SBE ppx, 05/2011 last check WNL, rtc 2 yrs  . Marijuana smoker in remission   . History of smoking   . Alcohol use    Past Surgical History  Procedure Laterality Date  . Transposition of great vessels repair      infant  . Holter  06/2011    rare PAC, PVC  . US echocardiography  05/2011    normal bi vent size/fxn, normal flow across all valves, subsystemic R vent pressure, patent aortic arch, no pericardial effusion  . Scrotal US  01/2012    1.3cm epididymal cyst on left, o/w normal   Family History  Problem Relation Age of Onset  . Diabetes Maternal Grandmother   . Coronary artery disease Maternal Uncle 60  . Cancer Maternal Uncle     pancreatic cancer  . Stroke Neg Hx    History  Substance Use Topics  . Smoking status: Current Every Day Smoker -- 0.50 packs/day for 2 years    Types: Cigarettes  . Smokeless tobacco: Current User    Types: Snuff  . Alcohol Use: Yes     Comment: Occasional    Review of Systems  Musculoskeletal: Positive for arthralgias.  All other systems reviewed and are  negative.    Allergies  Review of patient's allergies indicates no known allergies.  Home Medications   Current Outpatient Rx  Name  Route  Sig  Dispense  Refill  . fluticasone (FLONASE) 50 MCG/ACT nasal spray   Nasal   Place 2 sprays into the nose daily as needed. For nasal congestion         . methocarbamol (ROBAXIN) 500 MG tablet   Oral   Take 1 tablet (500 mg total) by mouth 2 (two) times daily.   30 tablet   0    BP 113/59  Pulse 73  Temp(Src) 98 F (36.7 C) (Oral)  Resp 16  Wt 170 lb (77.111 kg)  SpO2 97%  Physical Exam  Nursing note and vitals reviewed. Constitutional: He is oriented to person, place, and time. He appears well-developed and well-nourished. No distress.  HENT:  Head: Normocephalic and atraumatic.  Eyes: Conjunctivae and EOM are normal.  Neck: Normal range of motion. Neck supple.  Cardiovascular: Normal rate, regular rhythm and normal heart sounds.   Pulmonary/Chest: Effort normal and breath sounds normal. No respiratory distress. He has no wheezes.  Musculoskeletal:       Left ankle: He exhibits decreased range of motion and ecchymosis. He exhibits no swelling, no deformity, no laceration and normal pulse. Tenderness. Lateral  malleolus and head of 5th metatarsal tenderness found. Achilles tendon normal.       Feet:  TTP of lateral malleolus and head of 5th metatarsal, mild swelling and bruising without noted deformity, strong distal pulse and cap refill, sensation intact  Neurological: He is alert and oriented to person, place, and time.  Skin: Skin is warm and dry. He is not diaphoretic.  Psychiatric: He has a normal mood and affect.    ED Course   Procedures (including critical care time)  Labs Reviewed - No data to display Dg Ankle Complete Left  07/17/2013   *RADIOLOGY REPORT*  Clinical Data: Lateral ankle pain post injury  LEFT ANKLE COMPLETE - 3+ VIEW  Comparison: Left foot 05/06/2005  Findings: Three views of the left ankle  submitted.  No acute fracture or subluxation.  Ankle mortise is preserved.  IMPRESSION: No acute fracture or subluxation.   Original Report Authenticated By: Natasha Mead, M.D.   1. Ankle pain, left     MDM   X-ray negative for acute fracture or dislocation. Instructed pt he may wear brace if desired. Take over-the-counter anti-inflammatories as needed for pain. Patient will followup with his primary care physician if symptoms not improving in the next few days. Discussed plan with patient he agreed. Return cautious about the  Garlon Hatchet, PA-C 07/17/13 1610

## 2013-07-18 NOTE — ED Provider Notes (Signed)
Medical screening examination/treatment/procedure(s) were performed by non-physician practitioner and as supervising physician I was immediately available for consultation/collaboration.   Anivea Velasques E Joaquina Nissen, MD 07/18/13 1313 

## 2013-09-20 ENCOUNTER — Encounter: Payer: Self-pay | Admitting: Family Medicine

## 2013-09-20 ENCOUNTER — Ambulatory Visit (INDEPENDENT_AMBULATORY_CARE_PROVIDER_SITE_OTHER): Payer: BC Managed Care – PPO | Admitting: Family Medicine

## 2013-09-20 VITALS — BP 128/76 | HR 64 | Temp 97.8°F | Wt 171.2 lb

## 2013-09-20 DIAGNOSIS — H9202 Otalgia, left ear: Secondary | ICD-10-CM | POA: Insufficient documentation

## 2013-09-20 DIAGNOSIS — H9209 Otalgia, unspecified ear: Secondary | ICD-10-CM

## 2013-09-20 MED ORDER — FLUTICASONE PROPIONATE 50 MCG/ACT NA SUSP: 2.0000 | Freq: Every day | NASAL | Status: DC | PRN

## 2013-09-20 MED ORDER — METHOCARBAMOL 500 MG PO TABS: 500.0000 mg | ORAL_TABLET | Freq: Two times a day (BID) | ORAL | Status: DC

## 2013-09-20 NOTE — Progress Notes (Signed)
  Subjective:    [name], male    DOB: February 16, 1991, 22 y.o.   MRN: [id number]  HPI CC: ear pain  Several days ago started with ear pain - today feeling better.   Mild cough.  + PNdrainage.  No fevers/chills, tooth pain, headache, congestion, rhinorrhea.  No ST.  No muffled hearing.  No drainage from ears.  Has been wearing ear bud - may have misplaced head of ear bud.  Past Medical History  Diagnosis Date  . 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 Transposition of great arteries, corrected     infant, Dr. Elizebeth Brooking, no need for SBE ppx, 05/2011 last check WNL, rtc 2 yrs  . Marijuana smoker in remission   . History of smoking   . Alcohol use     Review of Systems Per HPI    Objective:   Physical Exam  Nursing note and vitals reviewed. Constitutional: He appears well-developed and well-nourished. No distress.  HENT:  Head: Normocephalic and atraumatic.  Right Ear: Hearing, tympanic membrane, external ear and ear canal normal.  Left Ear: Hearing, tympanic membrane, external ear and ear canal normal.  Nose: Nose normal. No mucosal edema or rhinorrhea. Right sinus exhibits no maxillary sinus tenderness and no frontal sinus tenderness. Left sinus exhibits no maxillary sinus tenderness and no frontal sinus tenderness.  Mouth/Throat: Uvula is midline, oropharynx is clear and moist and mucous membranes are normal. No oropharyngeal exudate, posterior oropharyngeal edema, posterior oropharyngeal erythema or tonsillar abscesses.  Eyes: Conjunctivae and EOM are normal. Pupils are equal, round, and reactive to light.  Lymphadenopathy:    He has no cervical adenopathy.       Assessment & Plan:

## 2013-09-20 NOTE — Patient Instructions (Signed)
Flu shot today. Ears look goo.d May use tylenol for discomfort if needed.

## 2013-09-20 NOTE — Assessment & Plan Note (Signed)
Now resolved. Exam normal. Reassured. Flu shot today.

## 2013-11-22 ENCOUNTER — Encounter: Payer: Self-pay | Admitting: Internal Medicine

## 2013-11-22 ENCOUNTER — Ambulatory Visit (INDEPENDENT_AMBULATORY_CARE_PROVIDER_SITE_OTHER): Payer: BC Managed Care – PPO | Admitting: Internal Medicine

## 2013-11-22 VITALS — BP 114/64 | HR 72 | Temp 98.4°F | Wt 170.0 lb

## 2013-11-22 DIAGNOSIS — R05 Cough: Secondary | ICD-10-CM

## 2013-11-22 DIAGNOSIS — M545 Low back pain: Secondary | ICD-10-CM

## 2013-11-22 MED ORDER — METHOCARBAMOL 500 MG PO TABS: 500.0000 mg | ORAL_TABLET | Freq: Two times a day (BID) | ORAL | Status: DC

## 2013-11-22 NOTE — Progress Notes (Signed)
Pre-visit discussion using our clinic review tool. No additional management support is needed unless otherwise documented below in the visit note.  

## 2013-11-22 NOTE — Progress Notes (Signed)
HPI  Pt presents to the clinic today with c/o cough x 1 week. The cough is unproductive. He denies fever, chills or body aches. He has not taken anything OTC. He has no history of allergies or asthma. He is a current smoker. He has had sick contacts. Additionally, he would like a refill of his Robaxin. He takes it occasionally for low back pain.   Review of Systems      Past Medical History  Diagnosis Date  . 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 Transposition of great arteries, corrected     infant, Dr. Elizebeth Brooking, no need for SBE ppx, 05/2011 last check WNL, rtc 2 yrs  . Marijuana smoker in remission   . History of smoking   . Alcohol use     Family History  Problem Relation Age of Onset  . Diabetes Maternal Grandmother   . Coronary artery disease Maternal Uncle 60  . Cancer Maternal Uncle     pancreatic cancer  . Stroke Neg Hx     History   Social History  . Marital Status: Single    Spouse Name: N/A    Number of Children: 0  . Years of Education: HS   Occupational History  . Not on file.   Social History Main Topics  . Smoking status: Current Every Day Smoker -- 0.50 packs/day for 2 years    Types: Cigarettes  . Smokeless tobacco: Current User    Types: Snuff  . Alcohol Use: Yes     Comment: Occasional  . Drug Use: Yes    Special: Marijuana     Comment: MJ  . Sexual Activity: Not on file   Other Topics Concern  . Not on file   Social History Narrative   Lives with grandparents and great grandmother, 1 dog.   Mother lives in [place], Father lives in [place], stays in touch with them.   Neighbor with Lorinda Creed   Edu: HS graduate   Activity: trying to stay active.    No Known Allergies   Constitutional:  Denies headache, fatigue, fever or abrupt weight changes.  HEENT:  Denies eye redness, eye pain, pressure behind the eyes, facial pain, nasal congestion, ear pain, ringing in the ears, wax buildup, runny nose or bloody  nose. Respiratory: Positive cough. Denies difficulty breathing or shortness of breath.  Cardiovascular: Denies chest pain, chest tightness, palpitations or swelling in the hands or feet.   No other specific complaints in a complete review of systems (except as listed in HPI above).  Objective:   BP 114/64  Pulse 72  Temp(Src) 98.4 F (36.9 C) (Oral)  Wt 170 lb (77.111 kg)  SpO2 98% Wt Readings from Last 3 Encounters:  11/22/13 170 lb (77.111 kg)  09/20/13 171 lb 4 oz (77.678 kg)  07/17/13 170 lb (77.111 kg) (70%*, Z = 0.53)   * Growth percentiles are based on CDC 2-20 Years data.     General: Appears his stated age, well developed, well nourished in NAD. HEENT: Head: normal shape and size; Eyes: sclera white, no icterus, conjunctiva pink, PERRLA and EOMs intact; Ears: Tm's gray and intact, normal light reflex; Nose: mucosa pink and moist, septum midline; Throat/Mouth: + PND. Teeth present, mucosa erythematous and moist, no exudate noted, no lesions or ulcerations noted.  Neck: Neck supple, trachea midline. No massses, lumps or thyromegaly present.  Cardiovascular: Normal rate and rhythm. S1,S2 noted.  No rubs or gallops  noted. Murmur noted.  No JVD or BLE edema. No carotid bruits noted. Pulmonary/Chest: Normal effort and positive vesicular breath sounds. No respiratory distress. No wheezes, rales or ronchi noted.      Assessment & Plan:   Cough, dry, with no evidence of viral or bacterial infection:  Get some rest and drink plenty of water Do salt water gargles for the sore throat OTC Delsym cough syrup  Low back pain:  Robaxin refilled today RTC as needed or if symptoms persist.

## 2013-11-22 NOTE — Patient Instructions (Signed)
Cough, Adult  A cough is a reflex that helps clear your throat and airways. It can help heal the body or may be a reaction to an irritated airway. A cough may only last 2 or 3 weeks (acute) or may last more than 8 weeks (chronic).  CAUSES Acute cough:  Viral or bacterial infections. Chronic cough:  Infections.  Allergies.  Asthma.  Post-nasal drip.  Smoking.  Heartburn or acid reflux.  Some medicines.  Chronic lung problems (COPD).  Cancer. SYMPTOMS   Cough.  Fever.  Chest pain.  Increased breathing rate.  High-pitched whistling sound when breathing (wheezing).  Colored mucus that you cough up (sputum). TREATMENT   A bacterial cough may be treated with antibiotic medicine.  A viral cough must run its course and will not respond to antibiotics.  Your caregiver may recommend other treatments if you have a chronic cough. HOME CARE INSTRUCTIONS   Only take over-the-counter or prescription medicines for pain, discomfort, or fever as directed by your caregiver. Use cough suppressants only as directed by your caregiver.  Use a cold steam vaporizer or humidifier in your bedroom or home to help loosen secretions.  Sleep in a semi-upright position if your cough is worse at night.  Rest as needed.  Stop smoking if you smoke. SEEK IMMEDIATE MEDICAL CARE IF:   You have pus in your sputum.  Your cough starts to worsen.  You cannot control your cough with suppressants and are losing sleep.  You begin coughing up blood.  You have difficulty breathing.  You develop pain which is getting worse or is uncontrolled with medicine.  You have a fever. MAKE SURE YOU:   Understand these instructions.  Will watch your condition.  Will get help right away if you are not doing well or get worse. Document Released: 05/17/2011 Document Revised: 02/10/2012 Document Reviewed: 05/17/2011 [facility] Patient Information 2014 [place], [place]. Cough, Adult  A cough is a  reflex that helps clear your throat and airways. It can help heal the body or may be a reaction to an irritated airway. A cough may only last 2 or 3 weeks (acute) or may last more than 8 weeks (chronic).  CAUSES Acute cough:  Viral or bacterial infections. Chronic cough:  Infections.  Allergies.  Asthma.  Post-nasal drip.  Smoking.  Heartburn or acid reflux.  Some medicines.  Chronic lung problems (COPD).  Cancer. SYMPTOMS   Cough.  Fever.  Chest pain.  Increased breathing rate.  High-pitched whistling sound when breathing (wheezing).  Colored mucus that you cough up (sputum). TREATMENT   A bacterial cough may be treated with antibiotic medicine.  A viral cough must run its course and will not respond to antibiotics.  Your caregiver may recommend other treatments if you have a chronic cough. HOME CARE INSTRUCTIONS   Only take over-the-counter or prescription medicines for pain, discomfort, or fever as directed by your caregiver. Use cough suppressants only as directed by your caregiver.  Use a cold steam vaporizer or humidifier in your bedroom or home to help loosen secretions.  Sleep in a semi-upright position if your cough is worse at night.  Rest as needed.  Stop smoking if you smoke. SEEK IMMEDIATE MEDICAL CARE IF:   You have pus in your sputum.  Your cough starts to worsen.  You cannot control your cough with suppressants and are losing sleep.  You begin coughing up blood.  You have difficulty breathing.  You develop pain which is getting worse or is  uncontrolled with medicine.  You have a fever. MAKE SURE YOU:   Understand these instructions.  Will watch your condition.  Will get help right away if you are not doing well or get worse. Document Released: 05/17/2011 Document Revised: 02/10/2012 Document Reviewed: 05/17/2011 [facility] Patient Information 2014 [place], [place].

## 2013-12-08 ENCOUNTER — Telehealth: Payer: Self-pay | Admitting: Family Medicine

## 2013-12-08 NOTE — Telephone Encounter (Signed)
error 

## 2013-12-09 ENCOUNTER — Ambulatory Visit (INDEPENDENT_AMBULATORY_CARE_PROVIDER_SITE_OTHER): Payer: BC Managed Care – PPO | Admitting: Internal Medicine

## 2013-12-09 ENCOUNTER — Encounter: Payer: Self-pay | Admitting: Internal Medicine

## 2013-12-09 VITALS — BP 118/72 | HR 63 | Temp 98.4°F | Wt 164.5 lb

## 2013-12-09 DIAGNOSIS — J069 Acute upper respiratory infection, unspecified: Secondary | ICD-10-CM

## 2013-12-09 MED ORDER — AZITHROMYCIN 250 MG PO TABS: ORAL_TABLET | ORAL | Status: DC

## 2013-12-09 NOTE — Progress Notes (Signed)
HPI  Pt presents to the clinic today with c/o cough and chest congestion. This started 2-3 days ago. He also has headache, muscle aches, sneezing. He does have a runny nose and is blowing green mucous out of his nose. He denies fever but has had chills and body aches. He has taken dayquil, nyquil and mucinex. He has no history of allergies or asthma. He is a current smoker. Of note, he was seen 12/22 for a cough that has not gone away.  Review of Systems      Past Medical History  Diagnosis Date  . 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 Transposition of great arteries, corrected     infant, Dr. Elizebeth Brooking, no need for SBE ppx, 05/2011 last check WNL, rtc 2 yrs  . Marijuana smoker in remission   . History of smoking   . Alcohol use     Family History  Problem Relation Age of Onset  . Diabetes Maternal Grandmother   . Coronary artery disease Maternal Uncle 60  . Cancer Maternal Uncle     pancreatic cancer  . Stroke Neg Hx     History   Social History  . Marital Status: Single    Spouse Name: N/A    Number of Children: 0  . Years of Education: HS   Occupational History  . Not on file.   Social History Main Topics  . Smoking status: Current Every Day Smoker -- 0.50 packs/day for 2 years    Types: Cigarettes  . Smokeless tobacco: Current User    Types: Snuff  . Alcohol Use: Yes     Comment: Occasional  . Drug Use: Yes    Special: Marijuana     Comment: MJ  . Sexual Activity: Not on file   Other Topics Concern  . Not on file   Social History Narrative   Lives with grandparents and great grandmother, 1 dog.   Mother lives in [place], Father lives in [place], stays in touch with them.   Neighbor with Lorinda Creed   Edu: HS graduate   Activity: trying to stay active.    No Known Allergies   Constitutional: Positive headache, fatigue. Denies fever or  abrupt weight changes.  HEENT:  Positive sore throat. Denies eye redness, eye pain,  pressure behind the eyes, facial pain, nasal congestion, ear pain, ringing in the ears, wax buildup, runny nose or bloody nose. Respiratory: Positive cough. Denies difficulty breathing or shortness of breath.  Cardiovascular: Denies chest pain, chest tightness, palpitations or swelling in the hands or feet.   No other specific complaints in a complete review of systems (except as listed in HPI above).  Objective:   BP 118/72  Pulse 63  Temp(Src) 98.4 F (36.9 C) (Oral)  Wt 164 lb 8 oz (74.617 kg)  SpO2 98% Wt Readings from Last 3 Encounters:  12/09/13 164 lb 8 oz (74.617 kg)  11/22/13 170 lb (77.111 kg)  09/20/13 171 lb 4 oz (77.678 kg)     General: Appears his stated age, well developed, well nourished in NAD. HEENT: Head: normal shape and size; Eyes: sclera white, no icterus, conjunctiva pink, PERRLA and EOMs intact; Ears: Tm's gray and intact, normal light reflex; Nose: mucosa pink and moist, septum midline; Throat/Mouth: + PND. Teeth present, mucosa erythematous and moist, no exudate noted, no lesions or ulcerations noted.  Neck: Mild cervical lymphadenopathy. Neck supple, trachea midline. No massses, lumps or thyromegaly present.  Cardiovascular: Normal rate and rhythm. S1,S2 noted. Murmur noted. No rubs or gallops noted. No JVD or BLE edema. No carotid bruits noted. Pulmonary/Chest: Normal effort and scattered rhonchi throughout. No respiratory distress. No wheezes, rales noted.      Assessment & Plan:   Upper Respiratory Infection:  Get some rest and drink plenty of water Do salt water gargles for the sore throat eRx for Azithromax x 5 days Continue Nyquil at night  RTC as needed or if symptoms persist.

## 2013-12-09 NOTE — Progress Notes (Signed)
Pre-visit discussion using our clinic review tool. No additional management support is needed unless otherwise documented below in the visit note.  

## 2013-12-09 NOTE — Patient Instructions (Signed)

## 2013-12-10 ENCOUNTER — Telehealth: Payer: Self-pay | Admitting: Family Medicine

## 2013-12-10 NOTE — Telephone Encounter (Signed)
Relevant patient education assigned to patient using Emmi. ° °

## 2014-12-07 ENCOUNTER — Encounter: Payer: Self-pay | Admitting: Family Medicine

## 2014-12-07 ENCOUNTER — Encounter: Payer: Self-pay | Admitting: *Deleted

## 2014-12-07 ENCOUNTER — Ambulatory Visit (INDEPENDENT_AMBULATORY_CARE_PROVIDER_SITE_OTHER): Payer: BLUE CROSS/BLUE SHIELD | Admitting: Family Medicine

## 2014-12-07 VITALS — BP 140/80 | HR 76 | Temp 98.1°F | Wt 181.8 lb

## 2014-12-07 DIAGNOSIS — Q205 Discordant atrioventricular connection: Secondary | ICD-10-CM

## 2014-12-07 DIAGNOSIS — F411 Generalized anxiety disorder: Secondary | ICD-10-CM

## 2014-12-07 MED ORDER — HYDROXYZINE HCL 25 MG PO TABS: 12.5000 mg | ORAL_TABLET | Freq: Three times a day (TID) | ORAL | Status: DC | PRN

## 2014-12-07 NOTE — Progress Notes (Signed)
BP 140/80 mmHg  Pulse 76  Temp(Src) 98.1 F (36.7 C) (Oral)  Wt 181 lb 12 oz (82.441 kg)   CC: discuss referral to cardiology  Subjective:    [name], male    DOB: 1991/11/14, 24 y.o.   MRN: [id number]  HPI: Danny Russo is a 24 y.o. male presenting on 12/07/2014 for Anxiety and Referral   Anxiety - works at [employer] - increased stress recently. Ongoing for last few months. 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 Noticing increased worrying recently - wonders if due to stress at work but also started worrying about heart again - see below. Denies any depression or SI/HI. Asks about benzos - has tried friends' in past and has helped.   Endorses increase in palpitations recently described as louder or heavier beats, not irregular or fast. Denies chest pain or tightness or dyspnea.   Transposition of great arteries, corrected infant, Dr. Elizebeth Brooking, no need for SBE ppx, 05/2011 last check WNL, rtc 2 yrs   US ECHOCARDIOGRAPHY Date: 05/2011 normal bi vent size/fxn, normal flow across all valves, subsystemic R vent pressure, patent aortic arch, no pericardial effusion   Relevant past medical, surgical, family and social history reviewed and updated as indicated. Interim medical history since our last visit reviewed. Allergies and medications reviewed and updated. No current outpatient prescriptions on file prior to visit.   No current facility-administered medications on file prior to visit.   Past Medical History  Diagnosis Date  . 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 Transposition of great arteries, corrected     infant, Dr. Elizebeth Brooking, no need for SBE ppx, 05/2011 last check WNL, rtc 2 yrs  . Marijuana smoker in remission   . History of smoking   . Alcohol use     Past Surgical History  Procedure Laterality Date  . Transposition of great vessels repair     infant  . Holter  06/2011    rare PAC, PVC  . US echocardiography  05/2011    normal bi vent size/fxn, normal flow across all valves, subsystemic R vent pressure, patent aortic arch, no pericardial effusion  . Scrotal US  01/2012    1.3cm epididymal cyst on left, o/w normal    History  Substance Use Topics  . Smoking status: Former Smoker -- 0.50 packs/day for 2 years    Types: Cigarettes  . Smokeless tobacco: Current User    Types: Snuff  . Alcohol Use: 0.0 oz/week    0 Not specified per week     Comment: Occasional   Review of Systems Per HPI unless specifically indicated above     Objective:    BP 140/80 mmHg  Pulse 76  Temp(Src) 98.1 F (36.7 C) (Oral)  Wt 181 lb 12 oz (82.441 kg)  Wt Readings from Last 3 Encounters:  12/07/14 181 lb 12 oz (82.441 kg)  12/09/13 164 lb 8 oz (74.617 kg)  11/22/13 170 lb (77.111 kg)    Physical Exam  Constitutional: He appears well-developed and well-nourished. No distress.  HENT:  Mouth/Throat: Oropharynx is clear and moist. No oropharyngeal exudate.  Neck: Normal range of motion. Neck supple.  Cardiovascular: Normal rate, regular rhythm and intact distal pulses.   Murmur (4/6 holosystolic murmur throughout) heard. Pulmonary/Chest: Effort normal and breath sounds normal. No respiratory distress. He has no wheezes. He has  no rales.  Musculoskeletal: He exhibits no edema.  Skin: Skin is warm and dry. No rash noted.  Psychiatric: He has a normal mood and affect. His speech is normal and behavior is normal. Thought content normal. His mood appears not anxious. He does not exhibit a depressed mood.  Nursing note and vitals reviewed.      Assessment & Plan:   Problem List Items Addressed This Visit    Transposition of great arteries, corrected    Overall stable. Palpitation description not concerning. Reasonable to refer back to cardiology to re establish with adult cardiologist. Prior saw Dr Elizebeth Brookingotton. Advised I don't think there will be  any newfound cardiac limitation to activity but reasonable to write letter recommending no heavy lifting >20lbs until evaluated by cardiology.    Relevant Orders      Ambulatory referral to Cardiology   Anxiety state - Primary    A total of 25 minutes were spent face-to-face with the patient during this encounter and over half of that time was spent on counseling and coordination of care  Discussed anxiety causes and treatment. Recommended healthy stress relieving strategies, recommend yoga, exercise, healthy diet, and personal time. Discussed pros and cons of pharmacologic treatment for anxiety, recommended against benzo 2/2 abuse/addiction/and dependence/tolerance potential. Pt declines daily antidepressant. Pt open to try hydroxyzine and will prescribe 12.5-25mg  TID PRN anxiety. Pt agrees with plan.    Relevant Medications      hydrOXYzine (ATARAX/VISTARIL) tablet       Follow up plan: Return as needed, for follow up visit.

## 2014-12-07 NOTE — Assessment & Plan Note (Signed)
A total of 25 minutes were spent face-to-face with the patient during this encounter and over half of that time was spent on counseling and coordination of care  Discussed anxiety causes and treatment. Recommended healthy stress relieving strategies, recommend yoga, exercise, healthy diet, and personal time. Discussed pros and cons of pharmacologic treatment for anxiety, recommended against benzo 2/2 abuse/addiction/and dependence/tolerance potential. Pt declines daily antidepressant. Pt open to try hydroxyzine and will prescribe 12.5-25mg  TID PRN anxiety. Pt agrees with plan.

## 2014-12-07 NOTE — Progress Notes (Signed)
Pre visit review using our clinic review tool, if applicable. No additional management support is needed unless otherwise documented below in the visit note. 

## 2014-12-07 NOTE — Assessment & Plan Note (Addendum)
Overall stable. Palpitation description not concerning. Reasonable to refer back to cardiology to re establish with adult cardiologist. Prior saw Dr Elizebeth Brookingotton. Advised I don't think there will be any newfound cardiac limitation to activity but reasonable to write letter recommending no heavy lifting >20lbs until evaluated by cardiology.

## 2014-12-07 NOTE — Patient Instructions (Addendum)
Work on healthy stress relieving strategies.  Try atarax 1/2 - 1 tablet as needed for anxiety.  Pass by [facility] for referral for heart doctor to establish care.  Good to see you today, call us with quesitons.

## 2014-12-22 ENCOUNTER — Ambulatory Visit (INDEPENDENT_AMBULATORY_CARE_PROVIDER_SITE_OTHER): Payer: BLUE CROSS/BLUE SHIELD | Admitting: Cardiovascular Disease

## 2014-12-22 ENCOUNTER — Encounter: Payer: Self-pay | Admitting: Cardiovascular Disease

## 2014-12-22 VITALS — BP 128/72 | HR 79 | Ht 70.0 in | Wt 184.5 lb

## 2014-12-22 DIAGNOSIS — R0789 Other chest pain: Secondary | ICD-10-CM

## 2014-12-22 DIAGNOSIS — Q205 Discordant atrioventricular connection: Secondary | ICD-10-CM

## 2014-12-22 DIAGNOSIS — F411 Generalized anxiety disorder: Secondary | ICD-10-CM

## 2014-12-22 DIAGNOSIS — R0602 Shortness of breath: Secondary | ICD-10-CM

## 2014-12-22 DIAGNOSIS — R011 Cardiac murmur, unspecified: Secondary | ICD-10-CM

## 2014-12-22 NOTE — Patient Instructions (Signed)
You are doing well. No medication changes were made.  We will schedule an echocardiogram for murmur  Please call us if you have new issues that need to be addressed before your next appt.  Your physician wants you to follow-up in: 12 months.  You will receive a reminder letter in the mail two months in advance. If you don't receive a letter, please call our office to schedule the follow-up appointment.

## 2014-12-22 NOTE — Assessment & Plan Note (Signed)
He has Atarax to take when necessary. He prefers herbal medication

## 2014-12-22 NOTE — Assessment & Plan Note (Signed)
Prior echocardiogram in 2012. We have ordered a new echocardiogram. Very prominent murmur on exam. Rare episodes of shortness of breath, chest pressure. We will request the records from pediatrics in [place]. Prior surgical report from 20 years not available at this time

## 2014-12-22 NOTE — Progress Notes (Signed)
[name], male    DOB: 1991-07-17, 24 y.o.   MRN: [id number]  HPI Comments: [name] is a 24 y.o. 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 He presents to establish care in the [place] office  Overall he reports that he has been doing well. He does report occasional palpitations, rare episodes of chest tightness, shortness of breath. He was recently unloading a truck at work, [employer], when he had some shortness of breath. He was concerned that he might be hurting himself. He was previously told prior echocardiogram with dilation of something in his heart. Always been told he has a murmur Recently started on low-dose Atarax for anxiety. Has not been taking his very much, trying to do it more naturally such as with herbals Otherwise active, does not play any sports. No limitations growing up but did not do contact sports  EKG on today's visit showing normal sinus rhythm with rate 79 bpm, no significant ST or T-wave changes  US ECHOCARDIOGRAPHY Date: 05/2011 normal bi vent size/fxn, normal flow across all valves, subsystemic R vent pressure, patent aortic arch, no pericardial effusion     No Known Allergies  Outpatient Encounter Prescriptions as of 12/22/2014  Medication Sig  . hydrOXYzine (ATARAX/VISTARIL) 25 MG tablet Take 0.5-1 tablets (12.5-25 mg total) by mouth 3 (three) times daily as needed for anxiety.    Past Medical History  Diagnosis Date  . 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 Transposition of great arteries, corrected     infant, Dr. Elizebeth Brooking, no need for SBE ppx, 05/2011 last check WNL, rtc 2 yrs  . Marijuana smoker in remission   . History of smoking   . Alcohol use     Past Surgical History  Procedure Laterality Date  . Transposition of great vessels repair      infant  . Holter   06/2011    rare PAC, PVC  . US echocardiography  05/2011    normal bi vent size/fxn, normal flow across all valves, subsystemic R vent pressure, patent aortic arch, no pericardial effusion  . Scrotal US  01/2012    1.3cm epididymal cyst on left, o/w normal    Social History  reports that he quit smoking about 2 months ago. His smoking use included Cigarettes. He has a 1 pack-year smoking history. His smokeless tobacco use includes Snuff. He reports that he drinks alcohol. He reports that he uses illicit drugs (Marijuana).  Family History family history includes Cancer in his maternal uncle; Coronary artery disease (age of onset: 77) in his maternal uncle; Diabetes in his maternal grandmother. There is no history of Stroke.   Review of Systems  Constitutional: Negative.   Respiratory: Negative.   Cardiovascular: Negative.   Gastrointestinal: Negative.   Musculoskeletal: Negative.   Neurological: Negative.   Hematological: Negative.   Psychiatric/Behavioral: Negative.   All other systems reviewed and are negative.   BP 128/72 mmHg  Pulse 79  Ht  (1.778 m)  Wt 184 lb 8 oz (83.689 kg)  BMI 26.47 kg/m2  Physical Exam  Constitutional: He is oriented to person, place, and time. He appears well-developed and well-nourished.  HENT:  Head: Normocephalic.  Nose: Nose normal.  Mouth/Throat: Oropharynx is clear and moist.  Eyes: Conjunctivae are normal. Pupils are equal, round, and reactive to light.  Neck: Normal range of motion. Neck supple. No JVD present.  Cardiovascular: Normal rate, regular rhythm, S1 normal, S2 normal and intact distal pulses.  Exam reveals no gallop and no friction rub.   Murmur heard.  Crescendo systolic murmur is present with a grade of 3/6  Pulmonary/Chest: Effort normal and breath sounds normal. No respiratory distress. He has no wheezes. He has no rales. He exhibits no tenderness.  Abdominal: Soft. Bowel sounds are normal. He exhibits no distension.  There is no tenderness.  Musculoskeletal: Normal range of motion. He exhibits no edema or tenderness.  Lymphadenopathy:    He has no cervical adenopathy.  Neurological: He is alert and oriented to person, place, and time. Coordination normal.  Skin: Skin is warm and dry. No rash noted. No erythema.  Psychiatric: He has a normal mood and affect. His behavior is normal. Judgment and thought content normal.      Assessment and Plan   Nursing note and vitals reviewed.

## 2015-01-12 ENCOUNTER — Other Ambulatory Visit: Payer: Self-pay

## 2015-01-12 ENCOUNTER — Other Ambulatory Visit (INDEPENDENT_AMBULATORY_CARE_PROVIDER_SITE_OTHER): Payer: BLUE CROSS/BLUE SHIELD

## 2015-01-12 DIAGNOSIS — R0789 Other chest pain: Secondary | ICD-10-CM

## 2015-01-12 DIAGNOSIS — Q205 Discordant atrioventricular connection: Secondary | ICD-10-CM

## 2015-01-12 DIAGNOSIS — R0602 Shortness of breath: Secondary | ICD-10-CM

## 2015-01-12 DIAGNOSIS — R011 Cardiac murmur, unspecified: Secondary | ICD-10-CM

## 2015-02-19 ENCOUNTER — Emergency Department: Payer: Self-pay | Admitting: Emergency Medicine

## 2015-04-07 ENCOUNTER — Telehealth: Payer: Self-pay | Admitting: Family Medicine

## 2015-04-07 NOTE — Telephone Encounter (Signed)
[facility] Client TELEPHONE ADVICE RECORD [facility]  Patient Name: Danny LlanosJARED Riedesel  DOB: 1991-11-08    Initial Comment (needs 3rd attempt)Caller states his BP is 180/90 he has been on amoxicillin.--he has slight heart burn.    Nurse Assessment  Nurse: Laural BenesJohnson, RN, Dondra SpryGail Date/Time Lamount Cohen(Eastern Time): 04/07/2015 2:37:45 PM  Confirm and document reason for call. If symptomatic, describe symptoms. ---Jilda PandaJared called in to speak with nurse.  Has the patient traveled out of the country within the last 30 days? ---No  Does the patient require triage? ---No  Please document clinical information provided and list any resource used. ---Nurse called the number given 3 separate attempts and was not able to reach [facility] advised if not an emergency, please call back to speak with nurse if an emergency and can not get to ED of own accord, call 911.     Guidelines    Guideline Title Affirmed Question Affirmed Notes       Final Disposition User   Attempt made - message left Laural BenesJohnson, Charity fundraiserN, Dondra SpryGail

## 2015-04-07 NOTE — Telephone Encounter (Signed)
Spoke with patient. He said he took his BP prior to dentist appt where he was to have tooth extracted. He was very nervous and thinks that's why BP was high. He took it again later x2 and it was 123/78 and 130/85. He denied any symptoms (HA, Blurred vision, weakness, CP). He will monitor BP and come in for eval if consistently higher than >140/90.

## 2015-10-30 ENCOUNTER — Ambulatory Visit (INDEPENDENT_AMBULATORY_CARE_PROVIDER_SITE_OTHER): Payer: BLUE CROSS/BLUE SHIELD | Admitting: Family Medicine

## 2015-10-30 ENCOUNTER — Encounter: Payer: Self-pay | Admitting: Family Medicine

## 2015-10-30 VITALS — BP 123/83 | HR 77 | Temp 97.7°F | Ht 70.0 in | Wt 184.8 lb

## 2015-10-30 DIAGNOSIS — J680 Bronchitis and pneumonitis due to chemicals, gases, fumes and vapors: Secondary | ICD-10-CM | POA: Insufficient documentation

## 2015-10-30 MED ORDER — ALBUTEROL SULFATE HFA 108 (90 BASE) MCG/ACT IN AERS
2.0000 | INHALATION_SPRAY | Freq: Four times a day (QID) | RESPIRATORY_TRACT | Status: DC | PRN
Start: 2015-10-30 — End: 2015-12-14

## 2015-10-30 MED ORDER — PREDNISONE 20 MG PO TABS: ORAL_TABLET | ORAL | Status: DC

## 2015-10-30 MED ORDER — AZITHROMYCIN 250 MG PO TABS: ORAL_TABLET | ORAL | Status: DC

## 2015-10-30 NOTE — Assessment & Plan Note (Signed)
Possible also bacterial infection. Treat with prednisone taper, albuterol as needed for wheeze and cough. Cover with antibiotics.  If not improving follow up for possible chest X-ray.

## 2015-10-30 NOTE — Patient Instructions (Signed)
Tylenol for sore throat.  Start prednisone taper.  Use albuterol inhaler for wheeze or shortness of breath.  Complete antibitoics.  if not improving, follow up for consideration of chest X-ray as ewell.  Go to ER with severe shortness of breath.

## 2015-10-30 NOTE — Progress Notes (Signed)
Subjective:    [name], male    DOB: 1991/11/29, 24 y.o.   MRN: [id number]  Sore Throat  This is a new problem. The current episode started 1 to 4 weeks ago (2 weeks ago, He reports he took diatomaceous earth... As a detox., had symptoms start afterward.). The problem has been unchanged. The pain is worse on the left side. There has been no fever. The pain is moderate. Associated symptoms include congestion, coughing and shortness of breath. Pertinent negatives include no ear pain. Associated symptoms comments: Lump on left side of throat.Marland Kitchen He has had no exposure to strep or mono. The treatment provided mild relief.  Cough This is a new problem. The current episode started 1 to 4 weeks ago ( started sa few days after the sore throat). The problem has been gradually worsening. The cough is productive of sputum. Associated symptoms include nasal congestion, a sore throat, shortness of breath and wheezing. Pertinent negatives include no chest pain, chills, ear congestion, ear pain, fever or myalgias. Associated symptoms comments: Sinus pressure. Risk factors: nonsmoker. Treatments tried:  tea, vitamin C, trying to trreat naturally. The treatment provided no relief. There is no history of asthma, COPD or environmental allergies.     Social History /Family History/Past Medical History reviewed and updated if needed.   Review of Systems  Constitutional: Negative for fever and chills.  HENT: Positive for congestion and sore throat. Negative for ear pain.   Respiratory: Positive for cough, shortness of breath and wheezing.   Cardiovascular: Negative for chest pain.  Musculoskeletal: Negative for myalgias.  Allergic/Immunologic: Negative for environmental allergies.       Objective:   Physical Exam  Constitutional: Vital signs are normal. He appears well-developed and well-nourished.  Non-toxic appearance. He does not appear ill. No distress.  HENT:  Head: Normocephalic and  atraumatic.  Right Ear: Hearing, tympanic membrane, external ear and ear canal normal. No tenderness. No foreign bodies. Tympanic membrane is not retracted and not bulging.  Left Ear: Hearing, tympanic membrane, external ear and ear canal normal. No tenderness. No foreign bodies. Tympanic membrane is not retracted and not bulging.  Nose: Nose normal. No mucosal edema or rhinorrhea. Right sinus exhibits no maxillary sinus tenderness and no frontal sinus tenderness. Left sinus exhibits no maxillary sinus tenderness and no frontal sinus tenderness.  Mouth/Throat: Uvula is midline, oropharynx is clear and moist and mucous membranes are normal. Normal dentition. No dental caries. No oropharyngeal exudate or tonsillar abscesses.  Eyes: Conjunctivae, EOM and lids are normal. Pupils are equal, round, and reactive to light. Lids are everted and swept, no foreign bodies found.  Neck: Trachea normal, normal range of motion and phonation normal. Neck supple. Carotid bruit is not present. No thyroid mass and no thyromegaly present.  Cardiovascular: Normal rate, regular rhythm, S1 normal, S2 normal, normal heart sounds, intact distal pulses and normal pulses.  Exam reveals no gallop.   No murmur heard. Pulmonary/Chest: Effort normal. No respiratory distress. He has no decreased breath sounds. He has wheezes. He has no rhonchi. He has no rales.  Diffuse, no respiratory distress.  Abdominal: Soft. Normal appearance and bowel sounds are normal. There is no hepatosplenomegaly. There is no tenderness. There is no rebound, no guarding and no CVA tenderness. No hernia.  Neurological: He is alert. He has normal reflexes.  Skin: Skin is warm, dry and intact. No rash noted.  Psychiatric: He has a normal mood and affect. His speech is normal  and behavior is normal. Judgment normal.          Assessment & Plan:

## 2015-10-30 NOTE — Progress Notes (Signed)
Pre visit review using our clinic review tool, if applicable. No additional management support is needed unless otherwise documented below in the visit note. 

## 2015-12-01 ENCOUNTER — Telehealth: Payer: Self-pay | Admitting: Cardiovascular Disease

## 2015-12-01 NOTE — Telephone Encounter (Signed)
Patient wanted to be seen sooner than later as he is having frequent tiredness.  Schedule first available in February and added to waitlist.

## 2015-12-01 NOTE — Telephone Encounter (Signed)
Pt added to my list, as well.

## 2015-12-04 ENCOUNTER — Encounter (HOSPITAL_COMMUNITY): Payer: Self-pay | Admitting: Emergency Medicine

## 2015-12-04 ENCOUNTER — Emergency Department (HOSPITAL_COMMUNITY): Payer: BLUE CROSS/BLUE SHIELD

## 2015-12-04 ENCOUNTER — Emergency Department (HOSPITAL_COMMUNITY)
Admission: EM | Admit: 2015-12-04 | Discharge: 2015-12-05 | Disposition: A | Discharge status: Home / Self Care | Payer: BLUE CROSS/BLUE SHIELD | Attending: Emergency Medicine | Admitting: Emergency Medicine

## 2015-12-04 DIAGNOSIS — R079 Chest pain, unspecified: Secondary | ICD-10-CM | POA: Diagnosis present

## 2015-12-04 DIAGNOSIS — R011 Cardiac murmur, unspecified: Secondary | ICD-10-CM | POA: Insufficient documentation

## 2015-12-04 DIAGNOSIS — Z79899 Other long term (current) drug therapy: Secondary | ICD-10-CM | POA: Diagnosis not present

## 2015-12-04 DIAGNOSIS — R0789 Other chest pain: Secondary | ICD-10-CM | POA: Diagnosis not present

## 2015-12-04 DIAGNOSIS — Z792 Long term (current) use of antibiotics: Secondary | ICD-10-CM | POA: Diagnosis not present

## 2015-12-04 DIAGNOSIS — Z87891 Personal history of nicotine dependence: Secondary | ICD-10-CM | POA: Insufficient documentation

## 2015-12-04 LAB — BASIC METABOLIC PANEL
Anion gap: 8 (ref 5–15)
BUN: 11 mg/dL (ref 6–20)
CHLORIDE: 104 mmol/L (ref 101–111)
CO2: 26 mmol/L (ref 22–32)
Calcium: 9.4 mg/dL (ref 8.9–10.3)
Creatinine, Ser: 0.72 mg/dL (ref 0.61–1.24)
GFR calc non Af Amer: >60 mL/min (ref >60)
Glucose, Bld: 117 mg/dL — ABNORMAL HIGH (ref 65–99)
POTASSIUM: 4 mmol/L (ref 3.5–5.1)
SODIUM: 138 mmol/L (ref 135–145)

## 2015-12-04 LAB — CBC
HEMATOCRIT: 41.3 % (ref 39.0–52.0)
Hemoglobin: 14.5 g/dL (ref 13.0–17.0)
MCH: 29.2 pg (ref 26.0–34.0)
MCHC: 35.1 g/dL (ref 30.0–36.0)
MCV: 83.3 fL (ref 78.0–100.0)
Platelets: 214 10*3/uL (ref 150–400)
RBC: 4.96 MIL/uL (ref 4.22–5.81)
RDW: 12.2 % (ref 11.5–15.5)
WBC: 4.9 10*3/uL (ref 4.0–10.5)

## 2015-12-04 LAB — I-STAT TROPONIN, ED: Troponin i, poc: 0 ng/mL (ref 0.00–0.08)

## 2015-12-04 NOTE — ED Provider Notes (Signed)
CSN: [id number]     Arrival date & time 12/04/15  2224 History  By signing my name below, I, Ronney Lion, attest that this documentation has been prepared under the direction and in the presence of Fayrene Helper, PA-C. Electronically Signed: Ronney Lion, ED Scribe. 12/04/2015. 11:58 PM.   Chief Complaint  Patient presents with  . Chest Pain   The history is provided by the patient. No language interpreter was used.    HPI Comments: Danny Russo is a 25 y.o. male with a history of transposition of great arteries repair, who presents to the Emergency Department complaining of tight chest pain that has been intermittent over the past week. He described his chest discomfort as a tightness sensation to the substernal region, worsening with taking deep breath having mild lightheadedness. Symptom has been intermittent throughout the week and not fully resolved. Patient had taken 2 aspirin prior to arrival, which alleviated his pain. He admits to drinking alcohol on a daily basis, usually drinking 2-3 beers a day. States that he drink more than usual last week and is trying to cut back on his drinking. Patient states he is "caffeine-sensitive" and he had drank caffeine yesterday. He felt that his drinking caffeine has worsening his symptoms. At this time his symptom is minimal. Pt sts "i just wanted to get checked out".  Furthermore patient denies having any fever, chills, dizziness, neck stiffness, back pain, abdominal pain, nausea vomiting diarrhea, diaphoresis, focal numbness or weakness. No prior history of PE or DVT, no recent surgery, prolonged bed rest, unilateral leg swelling or calf pain, active cancer. He does have a cardiologist in [place]. He denies history of GERD and states he does not think this is GERD.  Past Medical History  Diagnosis Date  . 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 Transposition of great arteries, corrected     infant, Dr. Elizebeth Brooking,  no need for SBE ppx, 05/2011 last check WNL, rtc 2 yrs  . 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 History of smoking   . Alcohol use [facility])    Past Surgical History  Procedure Laterality Date  . Transposition of great vessels repair      infant  . Holter  06/2011    rare PAC, PVC  . US echocardiography  05/2011    normal bi vent size/fxn, normal flow across all valves, subsystemic R vent pressure, patent aortic arch, no pericardial effusion  . Scrotal US  01/2012    1.3cm epididymal cyst on left, o/w normal   Family History  Problem Relation Age of Onset  . Diabetes Maternal Grandmother   . Coronary artery disease Maternal Uncle 60  . Cancer Maternal Uncle     pancreatic cancer  . Stroke Neg Hx    Social History  Substance Use Topics  . Smoking status: Former Smoker -- 0.50 packs/day for 2 years    Types: Cigarettes    Quit date: 10/22/2014  . Smokeless tobacco: Former Neurosurgeon    Types: Snuff  . Alcohol Use: 0.0 oz/week    0 Standard drinks or equivalent per week     Comment: Occasional    Review of Systems  Cardiovascular: Positive for chest pain.   A complete 10 system review of systems was obtained and all systems are negative except as noted in the HPI and PMH.    Allergies  Review of patient's allergies indicates no known allergies.  Home Medications  Prior to Admission medications   Medication Sig Start Date End Date Taking? Authorizing Provider  albuterol (PROVENTIL HFA;VENTOLIN HFA) 108 (90 BASE) MCG/ACT inhaler Inhale 2 puffs into the lungs every 6 (six) hours as needed for wheezing or shortness of breath. 10/30/15   Amy Michelle NasutiE Bedsole, MD  azithromycin (ZITHROMAX) 250 MG tablet 2 tab po x 1 day then 1 tab po daily 10/30/15   Amy E Ermalene SearingBedsole, MD  predniSONE (DELTASONE) 20 MG tablet 3 tabs by mouth daily x 3 days, then 2 tabs by mouth daily x 2 days then 1 tab by mouth daily x 2 days 10/30/15   Amy E Ermalene SearingBedsole, MD   BP 127/75 mmHg  Pulse 86  Temp(Src) 97.5 F (36.4 C)  (Oral)  Resp 18  SpO2 100% Physical Exam  Constitutional: He appears well-developed and well-nourished. No distress.  HENT:  Head: Atraumatic.  Eyes: Conjunctivae are normal.  Neck: Neck supple.  Cardiovascular: Normal rate, regular rhythm and intact distal pulses.   Murmur (3/6 systolic heart murmur ) heard. Pulmonary/Chest: Effort normal and breath sounds normal. No respiratory distress. He has no wheezes. He has no rales. He exhibits no tenderness.  Abdominal: Soft. Bowel sounds are normal. He exhibits no distension. There is no tenderness.  Musculoskeletal: He exhibits no edema.  Neurological: He is alert.  Skin: No rash noted.  Psychiatric: He has a normal mood and affect.  Nursing note and vitals reviewed.   ED Course  Procedures (including critical care time) DIAGNOSTIC STUDIES: Oxygen Saturation is 100% on RA, normal by my interpretation.    COORDINATION OF CARE: 11:57 PM - Discussed treatment plan with pt at bedside which includes reassurance. Pt verbalized understanding and agreed to plan.  Labs Review Labs Reviewed  BASIC METABOLIC PANEL - Abnormal; Notable for the following:    Glucose, Bld 117 (*)    All other components within normal limits  CBC  I-STAT TROPOININ, ED    Imaging Review Dg Chest 2 View  12/05/2015  CLINICAL DATA:  Acute onset of chest tightness.  Initial encounter. EXAM: CHEST  2 VIEW COMPARISON:  Chest radiograph performed 11/13/2012 FINDINGS: The lungs are well-aerated and clear. There is no evidence of focal opacification, pleural effusion or pneumothorax. The heart is normal in size; the mediastinal contour is within normal limits. No acute osseous abnormalities are seen. IMPRESSION: No acute cardiopulmonary process seen. Electronically Signed   By: Roanna RaiderJeffery  Chang M.D.   On: 12/05/2015 00:28   I have personally reviewed and evaluated these images and lab results as part of my medical decision-making.   EKG Interpretation   Date/Time:   Monday December 04 2015 22:38:31 EST Ventricular Rate:  89 PR Interval:  170 QRS Duration: 106 QT Interval:  322 QTC Calculation: 391 R Axis:   98 Text Interpretation:  Normal sinus rhythm Rightward axis Incomplete right  bundle branch block Nonspecific T wave abnormality Abnormal ECG No  significant change since last tracing Confirmed by Erroll Lunani, Adeleke Ayokunle  [phone]([id number]) on 12/05/2015 12:17:07 AM      MDM   Final diagnoses:  Chest pain, atypical   BP 127/75 mmHg  Pulse 86  Temp(Src) 97.5 F (36.4 C) (Oral)  Resp 18  SpO2 100%   I personally performed the services described in this documentation, which was scribed in my presence. The recorded information has been reviewed and is accurate.     12:12 AM Patient presents with atypical chest discomfort which has been ongoing for the past week. EKG  shows no acute ischemic changes, normal troponin, labs are reassuring, chest x-ray without any acute finding. History of transposition of the great artery that has been corrected.  Symptoms may be attributed to anxiety as patient has tried to quit drinking after heavy drinking for the past week. He has been substitute drinking alcohol with drinking caffeine and felt that it has greatly affected him.  Given that his symptom is minimal at this time I discussed with Dr. Mora Bellman and we felt patient can follow-up with his primary care doctor and his cardiologist for further evaluation.  Pt voice understanding and agrees with plan.  Return precaution discussed.  Recommend alcohol cessation.    Fayrene Helper, PA-C 12/05/15 0038  Tomasita Crumble, MD 12/05/15 [phone]

## 2015-12-04 NOTE — ED Notes (Signed)
Dr. Mora Bellmanni to see and assess patient before RN assessment. See MD assessment.

## 2015-12-04 NOTE — ED Notes (Signed)
Pt. reports intermittent left chest pain radiating to left upper back with SOB onset last Monday , denies nausea or diaphoresis , denies cough or congestion . 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

## 2015-12-05 NOTE — ED Notes (Signed)
Patient verbalized understanding of discharge instructions and denies any further needs or questions at this time. VS stable. Patient ambulatory with steady gait.  

## 2015-12-05 NOTE — Discharge Instructions (Signed)
Nonspecific Chest Pain  °Chest pain can be caused by many different conditions. There is always a chance that your pain could be related to something serious, such as a heart attack or a blood clot in your lungs. Chest pain can also be caused by conditions that are not life-threatening. If you have chest pain, it is very important to follow up with your health care provider. °CAUSES  °Chest pain can be caused by: °· Heartburn. °· Pneumonia or bronchitis. °· Anxiety or stress. °· Inflammation around your heart (pericarditis) or lung (pleuritis or pleurisy). °· A blood clot in your lung. °· A collapsed lung (pneumothorax). It can develop suddenly on its own (spontaneous pneumothorax) or from trauma to the chest. °· Shingles infection (varicella-zoster virus). °· Heart attack. °· Damage to the bones, muscles, and cartilage that make up your chest wall. This can include: °¨ Bruised bones due to injury. °¨ Strained muscles or cartilage due to frequent or repeated coughing or overwork. °¨ Fracture to one or more ribs. °¨ Sore cartilage due to inflammation (costochondritis). °RISK FACTORS  °Risk factors for chest pain may include: °· Activities that increase your risk for trauma or injury to your chest. °· Respiratory infections or conditions that cause frequent coughing. °· Medical conditions or overeating that can cause heartburn. °· Heart disease or family history of heart disease. °· Conditions or health behaviors that increase your risk of developing a blood clot. °· Having had chicken pox (varicella zoster). °SIGNS AND SYMPTOMS °Chest pain can feel like: °· Burning or tingling on the surface of your chest or deep in your chest. °· Crushing, pressure, aching, or squeezing pain. °· Dull or sharp pain that is worse when you move, cough, or take a deep breath. °· Pain that is also felt in your back, neck, shoulder, or arm, or pain that spreads to any of these areas. °Your chest pain may come and go, or it may stay  constant. °DIAGNOSIS °Lab tests or other studies may be needed to find the cause of your pain. Your health care provider may have you take a test called an ambulatory ECG (electrocardiogram). An ECG records your heartbeat patterns at the time the test is performed. You may also have other tests, such as: °· Transthoracic echocardiogram (TTE). During echocardiography, sound waves are used to create a picture of all of the heart structures and to look at how blood flows through your heart. °· Transesophageal echocardiogram (TEE). This is a more advanced imaging test that obtains images from inside your body. It allows your health care provider to see your heart in finer detail. °· Cardiac monitoring. This allows your health care provider to monitor your heart rate and rhythm in real time. °· Holter monitor. This is a portable device that records your heartbeat and can help to diagnose abnormal heartbeats. It allows your health care provider to track your heart activity for several days, if needed. °· Stress tests. These can be done through exercise or by taking medicine that makes your heart beat more quickly. °· Blood tests. °· Imaging tests. °TREATMENT  °Your treatment depends on what is causing your chest pain. Treatment may include: °· Medicines. These may include: °¨ Acid blockers for heartburn. °¨ Anti-inflammatory medicine. °¨ Pain medicine for inflammatory conditions. °¨ Antibiotic medicine, if an infection is present. °¨ Medicines to dissolve blood clots. °¨ Medicines to treat coronary artery disease. °· Supportive care for conditions that do not require medicines. This may include: °¨ Resting. °¨ Applying heat   or cold packs to injured areas. °¨ Limiting activities until pain decreases. °HOME CARE INSTRUCTIONS °· If you were prescribed an antibiotic medicine, finish it all even if you start to feel better. °· Avoid any activities that bring on chest pain. °· Do not use any tobacco products, including  cigarettes, chewing tobacco, or electronic cigarettes. If you need help quitting, ask your health care provider. °· Do not drink alcohol. °· Take medicines only as directed by your health care provider. °· Keep all follow-up visits as directed by your health care provider. This is important. This includes any further testing if your chest pain does not go away. °· If heartburn is the cause for your chest pain, you may be told to keep your head raised (elevated) while sleeping. This reduces the chance that acid will go from your stomach into your esophagus. °· Make lifestyle changes as directed by your health care provider. These may include: °¨ Getting regular exercise. Ask your health care provider to suggest some activities that are safe for you. °¨ Eating a heart-healthy diet. A registered dietitian can help you to learn healthy eating options. °¨ Maintaining a healthy weight. °¨ Managing diabetes, if necessary. °¨ Reducing stress. °SEEK MEDICAL CARE IF: °· Your chest pain does not go away after treatment. °· You have a rash with blisters on your chest. °· You have a fever. °SEEK IMMEDIATE MEDICAL CARE IF:  °· Your chest pain is worse. °· You have an increasing cough, or you cough up blood. °· You have severe abdominal pain. °· You have severe weakness. °· You faint. °· You have chills. °· You have sudden, unexplained chest discomfort. °· You have sudden, unexplained discomfort in your arms, back, neck, or jaw. °· You have shortness of breath at any time. °· You suddenly start to sweat, or your skin gets clammy. °· You feel nauseous or you vomit. °· You suddenly feel light-headed or dizzy. °· Your heart begins to beat quickly, or it feels like it is skipping beats. °These symptoms may represent a serious problem that is an emergency. Do not wait to see if the symptoms will go away. Get medical help right away. Call your local emergency services (911 in the [place] Do not drive yourself to the hospital. °  °This  information is not intended to replace advice given to you by your health care provider. Make sure you discuss any questions you have with your health care provider. °  °Document Released: 08/28/2005 Document Revised: 12/09/2014 Document Reviewed: 06/24/2014 °Elsevier Interactive Patient Education ©2016 [employer] ° °

## 2015-12-12 NOTE — Telephone Encounter (Signed)
Front desk left message w/ pt's grandmother to see if he can come in today, as Dr. Mariah MillingGollan had a cancellation.

## 2015-12-14 ENCOUNTER — Ambulatory Visit (INDEPENDENT_AMBULATORY_CARE_PROVIDER_SITE_OTHER): Payer: BLUE CROSS/BLUE SHIELD | Admitting: Cardiovascular Disease

## 2015-12-14 ENCOUNTER — Encounter: Payer: Self-pay | Admitting: Cardiovascular Disease

## 2015-12-14 VITALS — BP 110/60 | HR 88 | Ht 68.0 in | Wt 179.0 lb

## 2015-12-14 DIAGNOSIS — Q205 Discordant atrioventricular connection: Secondary | ICD-10-CM | POA: Diagnosis not present

## 2015-12-14 DIAGNOSIS — F1010 Alcohol abuse, uncomplicated: Secondary | ICD-10-CM | POA: Diagnosis not present

## 2015-12-14 DIAGNOSIS — R079 Chest pain, unspecified: Secondary | ICD-10-CM | POA: Diagnosis not present

## 2015-12-14 DIAGNOSIS — Z87898 Personal history of other specified conditions: Secondary | ICD-10-CM | POA: Insufficient documentation

## 2015-12-14 DIAGNOSIS — F411 Generalized anxiety disorder: Secondary | ICD-10-CM

## 2015-12-14 NOTE — Assessment & Plan Note (Signed)
Etiology of his chest pain at rest is unclear. He is concerned that something is going on and he either needs "surgery or a medication" Symptoms not associated with exertion, typically coming on at rest. He is concerned about structural heart disease, very anxious about his prior surgery as a baby despite normal echocardiogram one year ago. CT scan of the chest with contrast has been ordered to evaluate his great vessels given history of transposition of the great vessels.

## 2015-12-14 NOTE — Assessment & Plan Note (Signed)
He reports that he is drinking 3-4 drinks per day, trying to cut back recently Heavy vomiting after heavy alcohol abuse 3 weeks ago. Felt that he "hurt" something Suggested he try to drastically cut back even abstain

## 2015-12-14 NOTE — Assessment & Plan Note (Signed)
As detailed above, previously intact cardiac anatomy in early 2016. No exertional related symptoms He feels that something is wrong, has new left-sided chest pain. Echocardiogram ordered to again confirm normal structural anatomy given history of transposition of the great vessels.

## 2015-12-14 NOTE — Assessment & Plan Note (Signed)
Very anxious on today's visit concerning his medical health. We will do a thorough evaluation to confirm normal cardiac structure. Unclear. May benefit from medication for anxiety

## 2015-12-14 NOTE — Progress Notes (Signed)
Patient ID: Caroleen HammanJared A Mangham, male    DOB: [birth year], 25 y.o.   MRN: [id number]  HPI Comments: Mr. Caroleen HammanJared A Rody is a 25 y.o. 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 and chest pain He has chronic murmur  On previous clinic visit, he reported palpitations, rare chest pain, shortness of breath Used to work for [employer] He had echocardiogram that showed stable cardiac function and post surgical anatomy  In follow-up today, he reports having chest pain on a more regular basis over the past several weeks He is very concerned that something is going on, "flap is coming loose" He has been working out at the gym in the past several weeks, able to exercise for up to 2 hours, do aerobic activity with no symptoms Several hours later, develops left side chest pain like his "arteries are on fire". Does not feel that he is overdoing it, has not been lifting heavy weights He does drink alcohol but has been tried to cut back the past 2-3 weeks Feels that he drank heavily 3 weeks ago, had severe nausea vomiting, wonders if he hurt something. He wonders if he needs surgery or a medication to make him feel better  EKG on today's visit shows no sinus rhythm with rate 88 bpm, no significant ST or T-wave changes  Other past medical history US ECHOCARDIOGRAPHY Date: 05/2011 normal bi vent size/fxn, normal flow across all valves, subsystemic R vent pressure, patent aortic arch, no pericardial effusion     No Known Allergies  Outpatient Encounter Prescriptions as of 12/14/2015  Medication Sig  . aspirin 81 MG tablet Take 81 mg by mouth as needed for pain.  . [DISCONTINUED] albuterol (PROVENTIL HFA;VENTOLIN HFA) 108 (90 BASE) MCG/ACT inhaler Inhale 2 puffs into the lungs every 6 (six) hours as needed for wheezing or shortness of breath. (Patient  not taking: Reported on 12/05/2015)  . [DISCONTINUED] azithromycin (ZITHROMAX) 250 MG tablet 2 tab po x 1 day then 1 tab po daily (Patient not taking: Reported on 12/05/2015)  . [DISCONTINUED] predniSONE (DELTASONE) 20 MG tablet 3 tabs by mouth daily x 3 days, then 2 tabs by mouth daily x 2 days then 1 tab by mouth daily x 2 days (Patient not taking: Reported on 12/05/2015)   No facility-administered encounter medications on file as of 12/14/2015.    Past Medical History  Diagnosis Date  . 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 Transposition of great arteries, corrected     infant, Dr. Elizebeth Brookingotton, no need for SBE ppx, 05/2011 last check WNL, rtc 2 yrs  . 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 History of smoking   . Alcohol use [facility](HCC)     Past Surgical History  Procedure Laterality Date  . Transposition of great vessels repair      infant  . Holter  06/2011    rare PAC, PVC  . [place] echocardiography  05/2011    normal bi vent size/fxn, normal flow across all valves, subsystemic R vent pressure, patent aortic arch, no pericardial effusion  . Scrotal us  01/2012    1.3cm epididymal cyst on left, o/w normal    Social History  reports that he quit smoking about 13 months ago. His smoking use included Cigarettes. He has a 1 pack-year smoking history.  He has quit using smokeless tobacco. His smokeless tobacco use included Snuff. He reports that he drinks alcohol. He reports that he does not use illicit drugs.  Family History family history includes Cancer in his maternal uncle; Coronary artery disease (age of onset: 65) in his maternal uncle; Diabetes in his maternal grandmother. There is no history of Stroke.   Review of Systems  Constitutional: Negative.   Respiratory: Negative.   Cardiovascular: Positive for chest pain.  Gastrointestinal: Negative.   Musculoskeletal: Negative.   Neurological: Negative.   Hematological: Negative.    Psychiatric/Behavioral: The patient is nervous/anxious.   All other systems reviewed and are negative.   BP 110/60 mmHg  Pulse 88  Ht 5\' 8"  (1.727 m)  Wt 179 lb (81.194 kg)  BMI 27.22 kg/m2  Physical Exam  Constitutional: He is oriented to person, place, and time. He appears well-developed and well-nourished.  HENT:  Head: Normocephalic.  Nose: Nose normal.  Mouth/Throat: Oropharynx is clear and moist.  Eyes: Conjunctivae are normal. Pupils are equal, round, and reactive to light.  Neck: Normal range of motion. Neck supple. No JVD present.  Cardiovascular: Normal rate, regular rhythm, S1 normal, S2 normal and intact distal pulses.  Exam reveals no gallop and no friction rub.   Murmur heard.  Crescendo systolic murmur is present with a grade of 3/6  Pulmonary/Chest: Effort normal and breath sounds normal. No respiratory distress. He has no wheezes. He has no rales. He exhibits no tenderness.  Abdominal: Soft. Bowel sounds are normal. He exhibits no distension. There is no tenderness.  Musculoskeletal: Normal range of motion. He exhibits no edema or tenderness.  Lymphadenopathy:    He has no cervical adenopathy.  Neurological: He is alert and oriented to person, place, and time. Coordination normal.  Skin: Skin is warm and dry. No rash noted. No erythema.  Psychiatric: He has a normal mood and affect. His behavior is normal. Judgment and thought content normal.      Assessment and Plan   Nursing note and vitals reviewed.

## 2015-12-14 NOTE — Patient Instructions (Addendum)
You are doing well. No medication changes were made.  We will schedule echocardiogram for transposition of the great vessels, murmur and chest pain  Your physician has requested that you have an echocardiogram. Echocardiography is a painless test that uses sound waves to create images of your heart. It provides your doctor with information about the size and shape of your heart and how well your heart's chambers and valves are working. This procedure takes approximately one hour. There are no restrictions for this procedure.  Date & time: ______________________________________________   We will also schedule a CT angiogram of the chest to look at transposition of the great vessels, chest pain  Date & time: ____Wednesday, January 18 @ 11:15_______________ At the [facility] Nothing to eat or drink 4 hours prior to your procedure  Please call us if you have new issues that need to be addressed before your next appt.  Your physician wants you to follow-up in: 12 months.  You will receive a reminder letter in the mail two months in advance. If you don't receive a letter, please call our office to schedule the follow-up appointment.    Echocardiogram An echocardiogram, or echocardiography, uses sound waves (ultrasound) to produce an image of your heart. The echocardiogram is simple, painless, obtained within a short period of time, and offers valuable information to your health care provider. The images from an echocardiogram can provide information such as:  Evidence of coronary artery disease (CAD).  Heart size.  Heart muscle function.  Heart valve function.  Aneurysm detection.  Evidence of a past heart attack.  Fluid buildup around the heart.  Heart muscle thickening.  Assess heart valve function. LET [facility] PROVIDER KNOW ABOUT:  Any allergies you have.  All medicines you are taking, including vitamins, herbs, eye drops, creams, and  over-the-counter medicines.  Previous problems you or members of your family have had with the use of anesthetics.  Any blood disorders you have.  Previous surgeries you have had.  Medical conditions you have.  Possibility of pregnancy, if this applies. BEFORE THE PROCEDURE  No special preparation is needed. Eat and drink normally.  PROCEDURE   In order to produce an image of your heart, gel will be applied to your chest and a wand-like tool (transducer) will be moved over your chest. The gel will help transmit the sound waves from the transducer. The sound waves will harmlessly bounce off your heart to allow the heart images to be captured in real-time motion. These images will then be recorded.  You may need an IV to receive a medicine that improves the quality of the pictures. AFTER THE PROCEDURE You may return to your normal schedule including diet, activities, and medicines, unless your health care provider tells you otherwise.   This information is not intended to replace advice given to you by your health care provider. Make sure you discuss any questions you have with your health care provider.   Document Released: 11/15/2000 Document Revised: 12/09/2014 Document Reviewed: 07/26/2013 Elsevier Interactive Patient Education 2016 [employer]  Cardiac CT Angiogram A cardiac CT angiogram is a test to help your health care provider find out why you are having chest pains or other symptoms of heart disease. The test uses an advanced type of X-ray machine that scans your heart and the area around the heart and creates multiple pictures of it. Other names for the test are coronary CT angiography, coronary artery scanning, and CTA.  The test is  painless and fairly quick. It is noninvasive. That means it does not involve any type of surgery or cuts (incisions). Instead, a fluid called contrast dye is injected into an IV tube in your arm. The contrast dye acts as a highlighter as it flows  through the veins. With the CT scan, it lets your health care provider see:   If the coronary arteries in your heart are more narrow than they should be, or if they are blocked.  If there is fluid around the heart.  If the muscles and tissues of the heart look weak or show signs of disease.  If the lungs contain any blood clots. LET [facility] CARE PROVIDER KNOW ABOUT:  Any allergies you have.   All medicines you are taking, including vitamins, herbs, eye drops, creams, and over-the-counter medicines.  Previous problems you or members of your family have had with the use of anesthetics.  Any blood disorders you have.  Previous surgeries you have had.  Medical conditions you have. RISKS AND COMPLICATIONS Generally, this is a safe procedure. However, as with any procedure, problems can occur. Possible problems include:   Allergic reaction to the contrast dye. This can range from mild to severe and may include:   Itching at the IV tube insertion site.   Redness at the IV tube insertion site.   Hives.   Nausea.   Difficulty breathing.   Kidney failure.   Problems from radiation exposure. This test involves the use of radiation. Radiation exposure can be dangerous to a pregnant patient and fetus. If you are pregnant, shields are used to protect your belly and pelvic area. More details are available from your health care provider. BEFORE THE PROCEDURE  The day before the test:    Stop drinking caffeinated beverages. These include energy drinks, tea, soda, coffee, and hot chocolate.  Stop taking medicines to treat erectile dysfunction. They can interfere with medicines you may be given during the procedure. Check with your health care provider if you should stop taking any other medicines. On the day of the test:   About 4 hours before the test, stop eating and drinking anything but water as advised by your health care provider.  Avoid wearing jewelry. You will  have to undress from the waist up and wear a hospital gown. PROCEDURE  The hair on your chest may need to be shaved. This is done because small sticky patches called electrodes are put on your chest. These transmit information that helps monitor your heart during the test.  You might be given heart medicine during the test. This is done to control your heart rate during the test so a good image is obtained.  An IV tube will be inserted in your arm.  You will be asked to lie on a table with your arms above your head.  The contrast dye will be injected into the IV tube. You might feel warm or you may get a metallic taste in your mouth.  The table you are lying on will move into a large machine that will do the scanning.  You will be able to see, hear, and talk to the person running the machine while you are in it. Follow that person's directions. You may be asked to hold your breath for 2-3 seconds as pictures are taken.  The CT machine will move around you to take pictures. Do not move while it is scanning. This helps to get a good image of your heart.  When the best possible pictures have been taken, the machine will be turned off. The table will move out of the machine. The IV tube will then be removed. AFTER THE PROCEDURE  You will be allowed to get dressed and return to your normal activities.  Results will be interpreted by the health care provider and the results will be discussed with you.   This information is not intended to replace advice given to you by your health care provider. Make sure you discuss any questions you have with your health care provider.   Document Released: 10/31/2008 Document Revised: 12/09/2014 Document Reviewed: 08/04/2013 Elsevier Interactive Patient Education [employer]

## 2015-12-15 ENCOUNTER — Other Ambulatory Visit: Payer: Self-pay

## 2015-12-15 NOTE — Addendum Note (Signed)
Addended by: Rhea BeltonMOODY, Kenyata Napier R on: 12/15/2015 09:13 AM   Modules accepted: Orders

## 2015-12-20 ENCOUNTER — Ambulatory Visit: Admission: RE | Admit: 2015-12-20 | Payer: BLUE CROSS/BLUE SHIELD | Source: Ambulatory Visit

## 2015-12-20 ENCOUNTER — Ambulatory Visit
Admission: RE | Admit: 2015-12-20 | Payer: BLUE CROSS/BLUE SHIELD | Source: Ambulatory Visit | Admitting: Cardiovascular Disease

## 2015-12-21 ENCOUNTER — Other Ambulatory Visit: Payer: Self-pay

## 2015-12-21 ENCOUNTER — Ambulatory Visit (INDEPENDENT_AMBULATORY_CARE_PROVIDER_SITE_OTHER): Payer: BLUE CROSS/BLUE SHIELD

## 2015-12-21 DIAGNOSIS — Q205 Discordant atrioventricular connection: Secondary | ICD-10-CM | POA: Diagnosis not present

## 2015-12-21 DIAGNOSIS — R079 Chest pain, unspecified: Secondary | ICD-10-CM

## 2015-12-21 DIAGNOSIS — F1010 Alcohol abuse, uncomplicated: Secondary | ICD-10-CM | POA: Diagnosis not present

## 2015-12-21 DIAGNOSIS — F411 Generalized anxiety disorder: Secondary | ICD-10-CM | POA: Diagnosis not present

## 2016-01-03 ENCOUNTER — Ambulatory Visit
Admission: RE | Admit: 2016-01-03 | Discharge: 2016-01-03 | Disposition: A | Discharge status: Home / Self Care | Payer: BLUE CROSS/BLUE SHIELD | Source: Ambulatory Visit | Attending: Cardiovascular Disease | Admitting: Cardiovascular Disease

## 2016-01-03 DIAGNOSIS — R079 Chest pain, unspecified: Secondary | ICD-10-CM | POA: Insufficient documentation

## 2016-01-03 DIAGNOSIS — Q205 Discordant atrioventricular connection: Secondary | ICD-10-CM | POA: Diagnosis not present

## 2016-01-03 DIAGNOSIS — I77819 Aortic ectasia, unspecified site: Secondary | ICD-10-CM | POA: Insufficient documentation

## 2016-01-03 MED ORDER — IOHEXOL 350 MG/ML SOLN
100.0000 mL | Freq: Once | INTRAVENOUS | Status: AC | PRN
  Administered 2016-01-03: 100 mL via INTRAVENOUS

## 2016-01-05 ENCOUNTER — Ambulatory Visit: Payer: BLUE CROSS/BLUE SHIELD

## 2016-01-23 ENCOUNTER — Ambulatory Visit: Payer: BLUE CROSS/BLUE SHIELD | Admitting: Cardiovascular Disease

## 2016-01-24 ENCOUNTER — Telehealth: Payer: Self-pay

## 2016-01-24 NOTE — Telephone Encounter (Signed)
Discussed with Dr. Mariah Milling here in the office and he stated that patient did not need to take premeds. Called and spoke with patients grandfather and let him know that the patient does not need to take any premeds for his dental procedure per Dr. Mariah Milling. He verbalized understanding. Told him to call back if there were any questions.

## 2016-01-24 NOTE — Telephone Encounter (Signed)
Pt needs a lot of dental work and needs pre med regimen. Please call.

## 2016-08-22 ENCOUNTER — Encounter: Payer: Self-pay | Admitting: Family Medicine

## 2016-08-22 ENCOUNTER — Ambulatory Visit (INDEPENDENT_AMBULATORY_CARE_PROVIDER_SITE_OTHER): Payer: BLUE CROSS/BLUE SHIELD | Admitting: Family Medicine

## 2016-08-22 VITALS — BP 122/72 | HR 63 | Temp 97.7°F | Wt 162.0 lb

## 2016-08-22 DIAGNOSIS — F1020 Alcohol dependence, uncomplicated: Secondary | ICD-10-CM | POA: Diagnosis not present

## 2016-08-22 DIAGNOSIS — G8929 Other chronic pain: Secondary | ICD-10-CM | POA: Diagnosis not present

## 2016-08-22 DIAGNOSIS — R070 Pain in throat: Secondary | ICD-10-CM | POA: Diagnosis not present

## 2016-08-22 NOTE — Progress Notes (Signed)
Subjective:    [name], male    DOB: Feb 27, 1991, 25 y.o.   MRN: [id number]  HPI This is a 25 yo male who presents today with sore throat for at least 1 month. Throat feels irritated, dry and has been more noticeable for last 2-3 weeks. Feels like something is in his lower throat, worse on left side. No fevers. No ear pain, no drainage, some nasal congestion. No heart burn, no abdominal pain, no nausea, no vomiting. Drinks 1-3 alcoholic drinks daily. Is very concerned about cancer. Has taken ibuprofen a couple of times with minimal relief. He prefers to not take medicine and to treat "naturally." He has been working on eating healthy and weight loss is intentional. He has a goal to decrease alcohol consumption but admits it has been difficult. He lives with his grandparents, keeps liquor in his room.    Past Medical History:  Diagnosis Date  . Alcohol use (HCC)   . History of smoking   . 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 Transposition of great arteries, corrected    infant, Dr. Elizebeth Brooking, no need for SBE ppx, 05/2011 last check WNL, rtc 2 yrs   Past Surgical History:  Procedure Laterality Date  . holter  06/2011   rare PAC, PVC  . scrotal US  01/2012   1.3cm epididymal cyst on left, o/w normal  . TRANSPOSITION OF GREAT VESSELS REPAIR     infant  . US ECHOCARDIOGRAPHY  05/2011   normal bi vent size/fxn, normal flow across all valves, subsystemic R vent pressure, patent aortic arch, no pericardial effusion   Family History  Problem Relation Age of Onset  . Diabetes Maternal Grandmother   . Coronary artery disease Maternal Uncle 60  . Cancer Maternal Uncle     pancreatic cancer  . Stroke Neg Hx    Social History  Substance Use Topics  . Smoking status: Former Smoker    Packs/day: 0.50    Years: 2.00    Types: Cigarettes    Quit date: 10/22/2014  . Smokeless tobacco: Former  Neurosurgeon    Types: Snuff  . Alcohol use 0.0 oz/week     Comment: Occasional      Review of Systems Per HPI    Objective:   Physical Exam  Constitutional: He is oriented to person, place, and time. He appears well-developed and well-nourished. No distress.  HENT:  Head: Normocephalic and atraumatic.  Right Ear: External ear normal.  Left Ear: External ear normal.  Nose: Nose normal.  Mouth/Throat: Uvula is midline, oropharynx is clear and moist and mucous membranes are normal. No oral lesions. No uvula swelling. No oropharyngeal exudate, posterior oropharyngeal edema or posterior oropharyngeal erythema.  Eyes: Conjunctivae are normal.  Neck: Normal range of motion. Neck supple.  Cardiovascular: Normal rate and regular rhythm.   Murmur heard. Pulmonary/Chest: Effort normal and breath sounds normal.  Musculoskeletal: Normal range of motion.  Lymphadenopathy:    He has no cervical adenopathy.  Neurological: He is alert and oriented to person, place, and time.  Skin: Skin is warm and dry. He is not diaphoretic.  Psychiatric: He has a normal mood and affect. His behavior is normal. Judgment and thought content normal.  Vitals reviewed.        BP 122/72   Pulse 63   Temp 97.7 F (36.5 C)   Wt 162 lb (73.5  kg)   SpO2 98%   BMI 24.63 kg/m  Wt Readings from Last 3 Encounters:  08/22/16 162 lb (73.5 kg)  12/14/15 179 lb (81.2 kg)  10/30/15 184 lb 12 oz (83.8 kg)    Assessment & Plan:  1. Chronic throat pain - discussed possible causes including post nasal drainage, silent reflux. Discussed treatment options- could do trial of H2 blocker/PPI. Patient is not interested in trial of treatment, prefers to be evaluated by specialist. - Ambulatory referral to ENT  2. Alcohol dependence, daily use (HCC) - discussed current recommendation for alcohol consumption limit to 2 drinks daily for men - explored barriers to reducing consumption and ways to overcome these (change of habits  associated with drinking, not keeping alcohol in his room).   Olean Reeeborah Jasmarie Coppock, FNP-BC  [facility]  08/22/2016 9:05 AM

## 2017-02-26 ENCOUNTER — Ambulatory Visit (INDEPENDENT_AMBULATORY_CARE_PROVIDER_SITE_OTHER): Payer: BLUE CROSS/BLUE SHIELD | Admitting: Family Medicine

## 2017-02-26 ENCOUNTER — Ambulatory Visit: Payer: BLUE CROSS/BLUE SHIELD | Admitting: Family Medicine

## 2017-02-26 ENCOUNTER — Encounter: Payer: Self-pay | Admitting: Family Medicine

## 2017-02-26 VITALS — BP 112/74 | HR 73 | Temp 98.2°F | Ht 68.0 in | Wt 167.4 lb

## 2017-02-26 DIAGNOSIS — R300 Dysuria: Secondary | ICD-10-CM | POA: Diagnosis not present

## 2017-02-26 LAB — POC URINALSYSI DIPSTICK (AUTOMATED)
BILIRUBIN UA: NEGATIVE
GLUCOSE UA: NEGATIVE
Ketones, UA: NEGATIVE
Protein, UA: NEGATIVE
SPEC GRAV UA: 1.02 (ref 1.030–1.035)
Urobilinogen, UA: 0.2 (ref ?–2.0)
pH, UA: 6 (ref 5.0–8.0)

## 2017-02-26 LAB — URINALYSIS, MICROSCOPIC ONLY

## 2017-02-26 MED ORDER — NITROFURANTOIN MONOHYD MACRO 100 MG PO CAPS: 100.0000 mg | ORAL_CAPSULE | Freq: Two times a day (BID) | ORAL | 0 refills | Status: DC

## 2017-02-26 NOTE — Patient Instructions (Signed)
Suspect urinary tract infection  Treat with antibiotic for 7 days  If you have new or worsening symptoms return to care. Hopeful for improvement in symptoms in 24-48 hours on antibiotic.

## 2017-02-26 NOTE — Progress Notes (Signed)
Subjective:  Danny Russo is a 26 y.o. year old very pleasant male patient who presents for/with See problem oriented charting ROS- complains of subjective fevers, temperature up to 100.2 at home. Some fatigue. No shortness of breath or chest pain or body aches   Past Medical History-  Patient Active Problem List   Diagnosis Date Noted  . Chest pain at rest 12/14/2015  . Alcohol abuse 12/14/2015  . Acute bronchitis due to chemical (HCC) 10/30/2015  . Anxiety state 12/07/2014  . Transposition of great arteries, corrected   . Healthcare maintenance 05/21/2011    Medications- reviewed and updated Current Outpatient Prescriptions  Medication Sig Dispense Refill  . aspirin 81 MG tablet Take 81 mg by mouth as needed for pain.    . nitrofurantoin, macrocrystal-monohydrate, (MACROBID) 100 MG capsule Take 1 capsule (100 mg total) by mouth 2 (two) times daily. 14 capsule 0   No current facility-administered medications for this visit.     Objective: BP 112/74 (BP Location: Left Arm, Patient Position: Sitting, Cuff Size: Normal)   Pulse 73   Temp 98.2 F (36.8 C) (Oral)   Ht 5\' 8"  (1.727 m)   Wt 167 lb 6.4 oz (75.9 kg)   SpO2 97%   BMI 25.45 kg/m  Gen: NAD, resting comfortably CV: RRR no murmurs rubs or gallops Lungs: CTAB no crackles, wheeze, rhonchi Abdomen: soft/nontender/nondistended/normal bowel sounds. No rebound or guarding.  No suprapubic or cva tenderness Ext: no edema Skin: warm, dry Rectal: normal tone, normal sized prostate, no masses or tenderness  Results for orders placed or performed in visit on 02/26/17 (from the past 24 hour(s))  Urine Microscopic     Status: Abnormal   Collection Time: 02/26/17 12:01 PM  Result Value Ref Range   WBC, UA 11-20/hpf (A) 0-2/hpf   RBC / HPF 21-50/hpf (A) 0-2/hpf   Mucus, UA Presence of (A) None   Squamous Epithelial / LPF Rare(0-4/hpf) Rare(0-4/hpf)  POCT Urinalysis Dipstick (Automated)     Status: Abnormal   Collection Time:  02/26/17 12:21 PM  Result Value Ref Range   Color, UA yellow    Clarity, UA cloudy    Glucose, UA N    Bilirubin, UA N    Ketones, UA N    Spec Grav, UA 1.020 1.030 - 1.035   Blood, UA 2+    pH, UA 6.0 5.0 - 8.0   Protein, UA N    Urobilinogen, UA 0.2 Negative - 2.0   Nitrite, UA 1+    Leukocytes, UA moderate (2+) (A) Negative    Assessment/Plan:  Dysuria - Plan: POCT Urinalysis Dipstick (Automated), Urine culture, Urine Microscopic S:  Feels like he has been run down lately working a lot and has felt some fatigue. Had fever for a day or two, last night it was 100.2. Having frequent urination and burning sensation for 3-4 days. No rectal pain. Mild upset stomach a few days ago. No discharge from penis. Had blood in his urine he wonders- slight faint red at the end of urinary stream. Mild suprapubic pain. Mild low back pain.   Not sexually active in next year. Had unprotected sex but has been tested and was negative and has not had partner since then- [facility].  A/P: 26 year old male with presumed UTI. No evidence of prostatitis on exam though his slightly elevated temperature concerns me some- doubt pyelonephritis at this point. Suspect UTi could be causing hematuria. I have advised if he does  not improve in 24-48 hours or if symptoms do not completely resolve within 1 week of nitrofurantoin treatment to seek care with PCP.   Pending culture. Also advised follow up with PCP for clearance of hematuria even if symptoms do clear- in coming months. We also discussed risks of nitrofurantoin  Of note- discussed STD testing but as mentioned- not active in over a year and after last sexual activity had testing at health department so he declines.   Orders Placed This Encounter  Procedures  . Urine culture    [facility]  . Urine Microscopic  . POCT Urinalysis Dipstick (Automated)    Meds ordered this encounter  Medications  . nitrofurantoin, macrocrystal-monohydrate,  (MACROBID) 100 MG capsule    Sig: Take 1 capsule (100 mg total) by mouth 2 (two) times daily.    Dispense:  14 capsule    Refill:  0   The duration of face-to-face time during this visit was greater than 25 minutes. Greater than 50% of this time was spent in counseling, explanation of diagnosis, planning of further management, and/or coordination of care including answering patient questions about UTI and how they develop, going over return precautions and risk of prostatitis, pyelonephritis. .   Return precautions advised.  Tana Conch, MD

## 2017-02-26 NOTE — Progress Notes (Signed)
Pre visit review using our clinic review tool, if applicable. No additional management support is needed unless otherwise documented below in the visit note. 

## 2017-02-28 LAB — URINE CULTURE

## 2017-04-08 ENCOUNTER — Telehealth: Payer: Self-pay | Admitting: Cardiovascular Disease

## 2017-04-08 NOTE — Telephone Encounter (Signed)
3 attempts to schedule fu from recall list 12 MONTH F/U PER CHECKOUT 12/14/2015  Lm with family to cal office Deleting recall.

## 2017-05-08 ENCOUNTER — Emergency Department (HOSPITAL_COMMUNITY): Payer: BLUE CROSS/BLUE SHIELD

## 2017-05-08 ENCOUNTER — Encounter (HOSPITAL_COMMUNITY): Payer: Self-pay

## 2017-05-08 DIAGNOSIS — Z79899 Other long term (current) drug therapy: Secondary | ICD-10-CM | POA: Diagnosis not present

## 2017-05-08 DIAGNOSIS — Z7982 Long term (current) use of aspirin: Secondary | ICD-10-CM | POA: Insufficient documentation

## 2017-05-08 DIAGNOSIS — Z87891 Personal history of nicotine dependence: Secondary | ICD-10-CM | POA: Diagnosis not present

## 2017-05-08 DIAGNOSIS — M791 Myalgia: Secondary | ICD-10-CM | POA: Diagnosis not present

## 2017-05-08 DIAGNOSIS — R0789 Other chest pain: Secondary | ICD-10-CM | POA: Insufficient documentation

## 2017-05-08 LAB — BASIC METABOLIC PANEL
ANION GAP: 8 (ref 5–15)
BUN: 23 mg/dL — ABNORMAL HIGH (ref 6–20)
CO2: 27 mmol/L (ref 22–32)
Calcium: 9.5 mg/dL (ref 8.9–10.3)
Chloride: 103 mmol/L (ref 101–111)
Creatinine, Ser: 0.95 mg/dL (ref 0.61–1.24)
GFR calc Af Amer: >60 mL/min (ref >60)
GLUCOSE: 90 mg/dL (ref 65–99)
POTASSIUM: 4.3 mmol/L (ref 3.5–5.1)
Sodium: 138 mmol/L (ref 135–145)

## 2017-05-08 LAB — CBC
HEMATOCRIT: 43 % (ref 39.0–52.0)
HEMOGLOBIN: 15.4 g/dL (ref 13.0–17.0)
MCH: 31.2 pg (ref 26.0–34.0)
MCHC: 35.8 g/dL (ref 30.0–36.0)
MCV: 87.2 fL (ref 78.0–100.0)
Platelets: 182 10*3/uL (ref 150–400)
RBC: 4.93 MIL/uL (ref 4.22–5.81)
RDW: 13.2 % (ref 11.5–15.5)
WBC: 6.4 10*3/uL (ref 4.0–10.5)

## 2017-05-08 LAB — TROPONIN I: Troponin I: <0.03 ng/mL (ref <0.03)

## 2017-05-08 NOTE — ED Triage Notes (Signed)
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 VSS.

## 2017-05-09 ENCOUNTER — Emergency Department (HOSPITAL_COMMUNITY)
Admission: EM | Admit: 2017-05-09 | Discharge: 2017-05-09 | Disposition: A | Discharge status: Home / Self Care | Payer: BLUE CROSS/BLUE SHIELD | Attending: Emergency Medicine | Admitting: Emergency Medicine

## 2017-05-09 DIAGNOSIS — R0789 Other chest pain: Secondary | ICD-10-CM

## 2017-05-09 DIAGNOSIS — Z8774 Personal history of (corrected) congenital malformations of heart and circulatory system: Secondary | ICD-10-CM

## 2017-05-09 NOTE — Discharge Instructions (Signed)
Ibuprofen 600 mg every 6 hours as needed for pain.  Follow-up with cardiology in the next 3-4 days. [facility] has been provided in this discharge summary for you to call and make these arrangements.

## 2017-05-09 NOTE — ED Provider Notes (Signed)
[facility]-EMERGENCY DEPT Provider Note   CSN: [id number] Arrival date & time: 05/08/17  2147  By signing my name below, I, Cynda Acres, attest that this documentation has been prepared under the direction and in the presence of Geoffery Lyons, MD. Electronically Signed: Cynda Acres, Scribe. 05/09/17. 1:02 AM.  History   Chief Complaint Chief Complaint  Patient presents with  . Chest Pain   HPI Comments: Danny Russo is a 26 y.o. male with a history of bronchitis, who presents to the Emergency Department complaining of a gradual-onset, constant left-sided non radiating chest pain that began one week ago. Patient states he believes this is musculoskeletal due to increased running (3 miles) a few days ago. Chest pain did not onset until later that day. Patient states he has had heart problems as a child, which required surgery. Patient has not had any heart issues since. Patient denies any leg swelling or diaphoresis. Patient reports associated right shoulder pain and shortness of breath. Patient reports taking an aspirin with no relief. Deep inspiration makes his chest pain worse, nothing improves his pain. Patient denies any leg pain, nausea, vomiting, diarrhea, abdominal pain, or any additional symptoms.   The history is provided by the patient. No language interpreter was used.    Past Medical History:  Diagnosis Date  . Alcohol use   . History of smoking   . 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 Transposition of great arteries, corrected    infant, Dr. Elizebeth Brooking, no need for SBE ppx, 05/2011 last check WNL, rtc 2 yrs    Patient Active Problem List   Diagnosis Date Noted  . Chest pain at rest 12/14/2015  . Alcohol abuse 12/14/2015  . Acute bronchitis due to chemical (HCC) 10/30/2015  . Anxiety state 12/07/2014  . Transposition of great arteries, corrected   . Healthcare maintenance 05/21/2011     Past Surgical History:  Procedure Laterality Date  . holter  06/2011   rare PAC, PVC  . scrotal US  01/2012   1.3cm epididymal cyst on left, o/w normal  . TRANSPOSITION OF GREAT VESSELS REPAIR     infant  . US ECHOCARDIOGRAPHY  05/2011   normal bi vent size/fxn, normal flow across all valves, subsystemic R vent pressure, patent aortic arch, no pericardial effusion       Home Medications    Prior to Admission medications   Medication Sig Start Date End Date Taking? Authorizing Provider  aspirin 81 MG tablet Take 81 mg by mouth as needed for pain.    [provider]  nitrofurantoin, macrocrystal-monohydrate, (MACROBID) 100 MG capsule Take 1 capsule (100 mg total) by mouth 2 (two) times daily. 02/26/17   Shelva Majestic, MD    Family History Family History  Problem Relation Age of Onset  . Diabetes Maternal Grandmother   . Coronary artery disease Maternal Uncle 60  . Cancer Maternal Uncle        pancreatic cancer  . Stroke Neg Hx     Social History Social History  Substance Use Topics  . Smoking status: Former Smoker    Packs/day: 0.50    Years: 2.00    Types: Cigarettes    Quit date: 10/22/2014  . Smokeless tobacco: Former Neurosurgeon    Types: Snuff  . Alcohol use 0.0 oz/week     Comment: Occasional     Allergies   Patient has no known  allergies.   Review of Systems Review of Systems  Constitutional: Negative for chills, diaphoresis and fever.  Respiratory: Positive for shortness of breath.   Cardiovascular: Positive for chest pain. Negative for leg swelling.  Gastrointestinal: Negative for abdominal pain, diarrhea, nausea and vomiting.  Musculoskeletal: Positive for arthralgias (right shoulder).  All other systems reviewed and are negative.    Physical Exam Updated Vital Signs BP 113/67   Pulse (!) 55   Temp 97.9 F (36.6 C) (Oral)   Resp 16   Ht 5\' 8"  (1.727 m)   Wt 165 lb (74.8 kg)   SpO2 100%   BMI 25.09 kg/m   Physical Exam   Constitutional: He is oriented to person, place, and time. He appears well-developed and well-nourished.  HENT:  Head: Normocephalic and atraumatic.  Mouth/Throat: Oropharynx is clear and moist.  Eyes: Pupils are equal, round, and reactive to light.  Neck: Normal range of motion. Neck supple.  Cardiovascular: Normal rate and regular rhythm.   Murmur heard. 2/6 SEM is audible at the left lower sternal border.   Pulmonary/Chest: Effort normal and breath sounds normal. No respiratory distress. He has no wheezes. He has no rales.  Abdominal: Soft. Bowel sounds are normal. He exhibits no distension. There is no tenderness.  Musculoskeletal: Normal range of motion. He exhibits no edema or tenderness.  Neurological: He is alert and oriented to person, place, and time.  Skin: Skin is warm and dry.  Psychiatric: He has a normal mood and affect.  Nursing note and vitals reviewed.    ED Treatments / Results  DIAGNOSTIC STUDIES: Oxygen Saturation is 100% on RA, normal by my interpretation.    COORDINATION OF CARE: 1:00 AM Discussed treatment plan with pt at bedside and pt agreed to plan.   Labs (all labs ordered are listed, but only abnormal results are displayed) Labs Reviewed  BASIC METABOLIC PANEL - Abnormal; Notable for the following:       Result Value   BUN 23 (*)    All other components within normal limits  CBC  TROPONIN I    EKG  EKG Interpretation  Date/Time:  Thursday May 08 2017 22:02:35 EDT Ventricular Rate:  68 PR Interval:  170 QRS Duration: 104 QT Interval:  374 QTC Calculation: 397 R Axis:   104 Text Interpretation:  Normal sinus rhythm with sinus arrhythmia Rightward axis Incomplete right bundle branch block Borderline ECG Confirmed by Geoffery Lyons ([id number]) on 05/09/2017 12:52:42 AM       Radiology Dg Chest 2 View  Result Date: 05/08/2017 CLINICAL DATA:  Chest pain EXAM: CHEST  2 VIEW COMPARISON:  Chest radiograph 12/04/2015 FINDINGS: The heart size and  mediastinal contours are within normal limits. Both lungs are clear. The visualized skeletal structures are unremarkable. IMPRESSION: No active cardiopulmonary disease. Electronically Signed   By: Deatra Robinson M.D.   On: 05/08/2017 22:46    Procedures Procedures (including critical care time)  Medications Ordered in ED Medications - No data to display   Initial Impression / Assessment and Plan / ED Course  I have reviewed the triage vital signs and the nursing notes.  Pertinent labs & imaging results that were available during my care of the patient were reviewed by me and considered in my medical decision making (see chart for details).  Patient is a 26 year old with history of surgical repair of transposition of the great vessels as an infant presenting with chest discomfort. He reports experiencing this discomfort intermittently for the past week.  His workup today reveals an unchanged EKG and negative troponin. His chest x-ray is clear. The nature of his symptoms is most consistent with a musculoskeletal etiology. He will be discharged, to follow-up with cardiology as an outpatient.  Final Clinical Impressions(s) / ED Diagnoses   Final diagnoses:  None    New Prescriptions New Prescriptions   No medications on file   I personally performed the services described in this documentation, which was scribed in my presence. The recorded information has been reviewed and is accurate.        Geoffery Lyonselo, Alexie Lanni, MD 05/09/17 [phone]

## 2017-05-09 NOTE — ED Notes (Signed)
Patient left at this time with all belongings. 

## 2017-06-11 NOTE — Progress Notes (Signed)
Cardiology Office Note  Date:  06/12/2017   ID:  Danny Russo, DOB 1990/12/04, MRN [id number]  PCP:  Eustaquio Boyden, MD   Chief Complaint  Patient presents with  . other    Follow up [facility] for chest pain. Meds reviewed verbally with patient.      HPI:  [name] is a 26 y.o. 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 and chest pain  chronic murmur  Recent emergency room visit for chest pain Hospital records reviewed with the patient in detail Felt to be atypical in nature Long discussion with him, he felt it was from eating a high-fat diet, lots of eggs and cheese Has changed his diet and symptoms resolved  Denies any shortness of breath or chest pain, active, works out at the gym Last echocardiogram Jan 2017 Aortic root 3.9 cm, up from 3.5 cm in 2016 Normal cardiac function  Notes indicating alcohol intake daily  EKG on today's visit shows no sinus rhythm with rate 77 bpm, no significant ST or T-wave changes  Other past medical history US ECHOCARDIOGRAPHY Date: 05/2011 normal bi vent size/fxn, normal flow across all valves, subsystemic R vent pressure, patent aortic arch, no pericardial effusion    PMH:   has a past medical history of Alcohol use; History of smoking; History of venomous snake bite; Marijuana smoker in remission [facility]); and Transposition of great arteries, corrected.  PSH:    Past Surgical History:  Procedure Laterality Date  . holter  06/2011   rare PAC, PVC  . scrotal US  01/2012   1.3cm epididymal cyst on left, o/w normal  . TRANSPOSITION OF GREAT VESSELS REPAIR     infant  . US ECHOCARDIOGRAPHY  05/2011   normal bi vent size/fxn, normal flow across all valves, subsystemic R vent pressure, patent aortic arch, no pericardial  effusion    Current Outpatient Prescriptions  Medication Sig Dispense Refill  . aspirin 81 MG tablet Take 81 mg by mouth as needed for pain.     No current facility-administered medications for this visit.      Allergies:   Patient has no known allergies.   Social History:  The patient  reports that he quit smoking about 2 years ago. His smoking use included Cigarettes. He has a 1.00 pack-year smoking history. He has quit using smokeless tobacco. His smokeless tobacco use included Snuff. He reports that he drinks alcohol. He reports that he does not use drugs.   Family History:   family history includes Cancer in his maternal uncle; Coronary artery disease (age of onset: 59) in his maternal uncle; Diabetes in his maternal grandmother.    Review of Systems: Review of Systems  Constitutional: Negative.   Respiratory: Negative.   Cardiovascular: Positive for chest pain.  Gastrointestinal: Negative.   Musculoskeletal: Negative.   Neurological: Negative.   Psychiatric/Behavioral: Negative.   All other systems reviewed and are negative.    PHYSICAL EXAM: VS:  BP 116/60 (BP Location: Left Arm, Patient Position: Sitting, Cuff Size: Normal)   Pulse 77   Ht 5\' 7"  (1.702 m)   Wt 172 lb (78 kg)   BMI 26.94 kg/m  , BMI Body mass index is 26.94 kg/m. GEN: Well nourished, well developed, in no acute distress  HEENT: normal  Neck: no JVD, carotid bruits, or masses Cardiac: RRR; 2/6 sem LSB/RSB no rubs, or gallops,no edema  Respiratory:  clear to auscultation bilaterally, normal work of breathing GI: soft, nontender, nondistended, + BS MS: no deformity or atrophy  Skin: warm and dry, no rash Neuro:  Strength and sensation are intact Psych: euthymic mood, full affect    Recent Labs: 05/08/2017: BUN 23; Creatinine, Ser 0.95; Hemoglobin 15.4; Platelets 182; Potassium 4.3; Sodium 138    Lipid Panel No results found for: CHOL, HDL, LDLCALC, TRIG    Wt Readings from Last 3  Encounters:  06/12/17 172 lb (78 kg)  05/08/17 165 lb (74.8 kg)  02/26/17 167 lb 6.4 oz (75.9 kg)       ASSESSMENT AND PLAN:  Transposition of great arteries, corrected - Plan: EKG 12-Lead Surgery discussed with him in detail Echocardiogram ordered Anxiety state - Plan: EKG 12-Lead Stable  Chest pain at rest - Plan: EKG 12-Lead Atypical chest pain, resolved by changing his diet  Active, no symptoms with exertion   Aortic root dilatation Echocardiogram ordered to evaluate aortic root aneurysm/dilatation   Total encounter time more than 25 minutes  Greater than 50% was spent in counseling and coordination of care with the patient  Disposition:   F/U  12 months   Orders Placed This Encounter  Procedures  . EKG 12-Lead     Signed, Dossie Arbourim Kamaiyah Uselton, M.D., Ph.D. 06/12/2017  [facility], [place] [phone]

## 2017-06-12 ENCOUNTER — Ambulatory Visit (INDEPENDENT_AMBULATORY_CARE_PROVIDER_SITE_OTHER): Payer: BLUE CROSS/BLUE SHIELD | Admitting: Cardiovascular Disease

## 2017-06-12 ENCOUNTER — Encounter: Payer: Self-pay | Admitting: Cardiovascular Disease

## 2017-06-12 VITALS — BP 116/60 | HR 77 | Ht 67.0 in | Wt 172.0 lb

## 2017-06-12 DIAGNOSIS — Q205 Discordant atrioventricular connection: Secondary | ICD-10-CM

## 2017-06-12 DIAGNOSIS — R079 Chest pain, unspecified: Secondary | ICD-10-CM | POA: Diagnosis not present

## 2017-06-12 DIAGNOSIS — F411 Generalized anxiety disorder: Secondary | ICD-10-CM | POA: Diagnosis not present

## 2017-06-12 NOTE — Patient Instructions (Addendum)
Medication Instructions:   No medication changes made  Labwork:  No new labs needed  Testing/Procedures:  We will order an echocardiogram for dilated aortic root, transposition of the great vessel, surgical correction Echocardiography is a painless test that uses sound waves to create images of your heart. It provides your doctor with information about the size and shape of your heart and how well your heart's chambers and valves are working. This procedure takes approximately one hour. There are no restrictions for this procedure.  Follow-Up: It was a pleasure seeing you in the office today. Please call us if you have new issues that need to be addressed before your next appt.  [phone]  Your physician wants you to follow-up in: 12 months.  You will receive a reminder letter in the mail two months in advance. If you don't receive a letter, please call our office to schedule the follow-up appointment.  If you need a refill on your cardiac medications before your next appointment, please call your pharmacy.    Echocardiogram An echocardiogram, or echocardiography, uses sound waves (ultrasound) to produce an image of your heart. The echocardiogram is simple, painless, obtained within a short period of time, and offers valuable information to your health care provider. The images from an echocardiogram can provide information such as:  Evidence of coronary artery disease (CAD).  Heart size.  Heart muscle function.  Heart valve function.  Aneurysm detection.  Evidence of a past heart attack.  Fluid buildup around the heart.  Heart muscle thickening.  Assess heart valve function.  Tell a health care provider about:  Any allergies you have.  All medicines you are taking, including vitamins, herbs, eye drops, creams, and over-the-counter medicines.  Any problems you or family members have had with anesthetic medicines.  Any blood disorders you have.  Any  surgeries you have had.  Any medical conditions you have.  Whether you are pregnant or may be pregnant. What happens before the procedure? No special preparation is needed. Eat and drink normally. What happens during the procedure?  In order to produce an image of your heart, gel will be applied to your chest and a wand-like tool (transducer) will be moved over your chest. The gel will help transmit the sound waves from the transducer. The sound waves will harmlessly bounce off your heart to allow the heart images to be captured in real-time motion. These images will then be recorded.  You may need an IV to receive a medicine that improves the quality of the pictures. What happens after the procedure? You may return to your normal schedule including diet, activities, and medicines, unless your health care provider tells you otherwise. This information is not intended to replace advice given to you by your health care provider. Make sure you discuss any questions you have with your health care provider. Document Released: 11/15/2000 Document Revised: 07/06/2016 Document Reviewed: 07/26/2013 Elsevier Interactive Patient Education  2017 [employer]

## 2017-07-11 ENCOUNTER — Other Ambulatory Visit: Payer: BLUE CROSS/BLUE SHIELD

## 2017-07-23 ENCOUNTER — Other Ambulatory Visit: Payer: Self-pay

## 2017-07-23 ENCOUNTER — Ambulatory Visit (INDEPENDENT_AMBULATORY_CARE_PROVIDER_SITE_OTHER): Payer: BLUE CROSS/BLUE SHIELD

## 2017-07-23 DIAGNOSIS — R079 Chest pain, unspecified: Secondary | ICD-10-CM | POA: Diagnosis not present

## 2017-07-23 DIAGNOSIS — F411 Generalized anxiety disorder: Secondary | ICD-10-CM | POA: Diagnosis not present

## 2017-07-23 DIAGNOSIS — Q205 Discordant atrioventricular connection: Secondary | ICD-10-CM

## 2017-07-23 LAB — ECHOCARDIOGRAM COMPLETE
AV Area VTI index: 2.95 cm2/m2
AV Area mean vel: 5.35 cm2
AV area mean vel ind: 2.82 cm2/m2
AVCELMEANRAT: 1.18
AVG: 3 mmHg
CHL CUP AV VEL: 5.6
DOP CAL AO MEAN VELOCITY: 73.8 cm/s
E decel time: 215 msec
E/e' ratio: 4.77
FS: 28 % (ref 28–44)
IV/PV OW: 1
LA ID, A-P, ES: 30 mm
LA vol index: 18.6 mL/m2
LA vol: 35.3 mL
LADIAMINDEX: 1.58 cm/m2
LAVOLA4C: 22.8 mL
LDCA: 4.52 cm2
LEFT ATRIUM END SYS DIAM: 30 mm
LV E/e' medial: 4.77
LV E/e'average: 4.77
LV e' LATERAL: 18.3 cm/s
LVOT VTI: 24.3 cm
LVOT diameter: 24 mm
LVOT peak VTI: 1.24 cm
LVOTSV: 110 mL
MV Dec: 215
MV Peak grad: 3 mmHg
MVAP: 3.49 cm2
MVPKAVEL: 49.5 m/s
MVPKEVEL: 87.3 m/s
P 1/2 time: 63 ms
PW: 9 mm — AB (ref 0.6–1.1)
RV LATERAL S' VELOCITY: 8.16 cm/s
RV TAPSE: 17.3 mm
TDI e' lateral: 18.3
TDI e' medial: 8.43
VTI: 19.6 cm
Valve area index: 2.95
Valve area: 5.6 cm2

## 2017-09-22 IMAGING — CR DG CHEST 2V
2 series · 2 of 2 positions shown · non-contrast
Comparison: Chest radiograph performed 11/13/2012

CLINICAL DATA: Acute onset of chest tightness.  Initial encounter.

EXAM:
CHEST  2 VIEW

[chest pa]
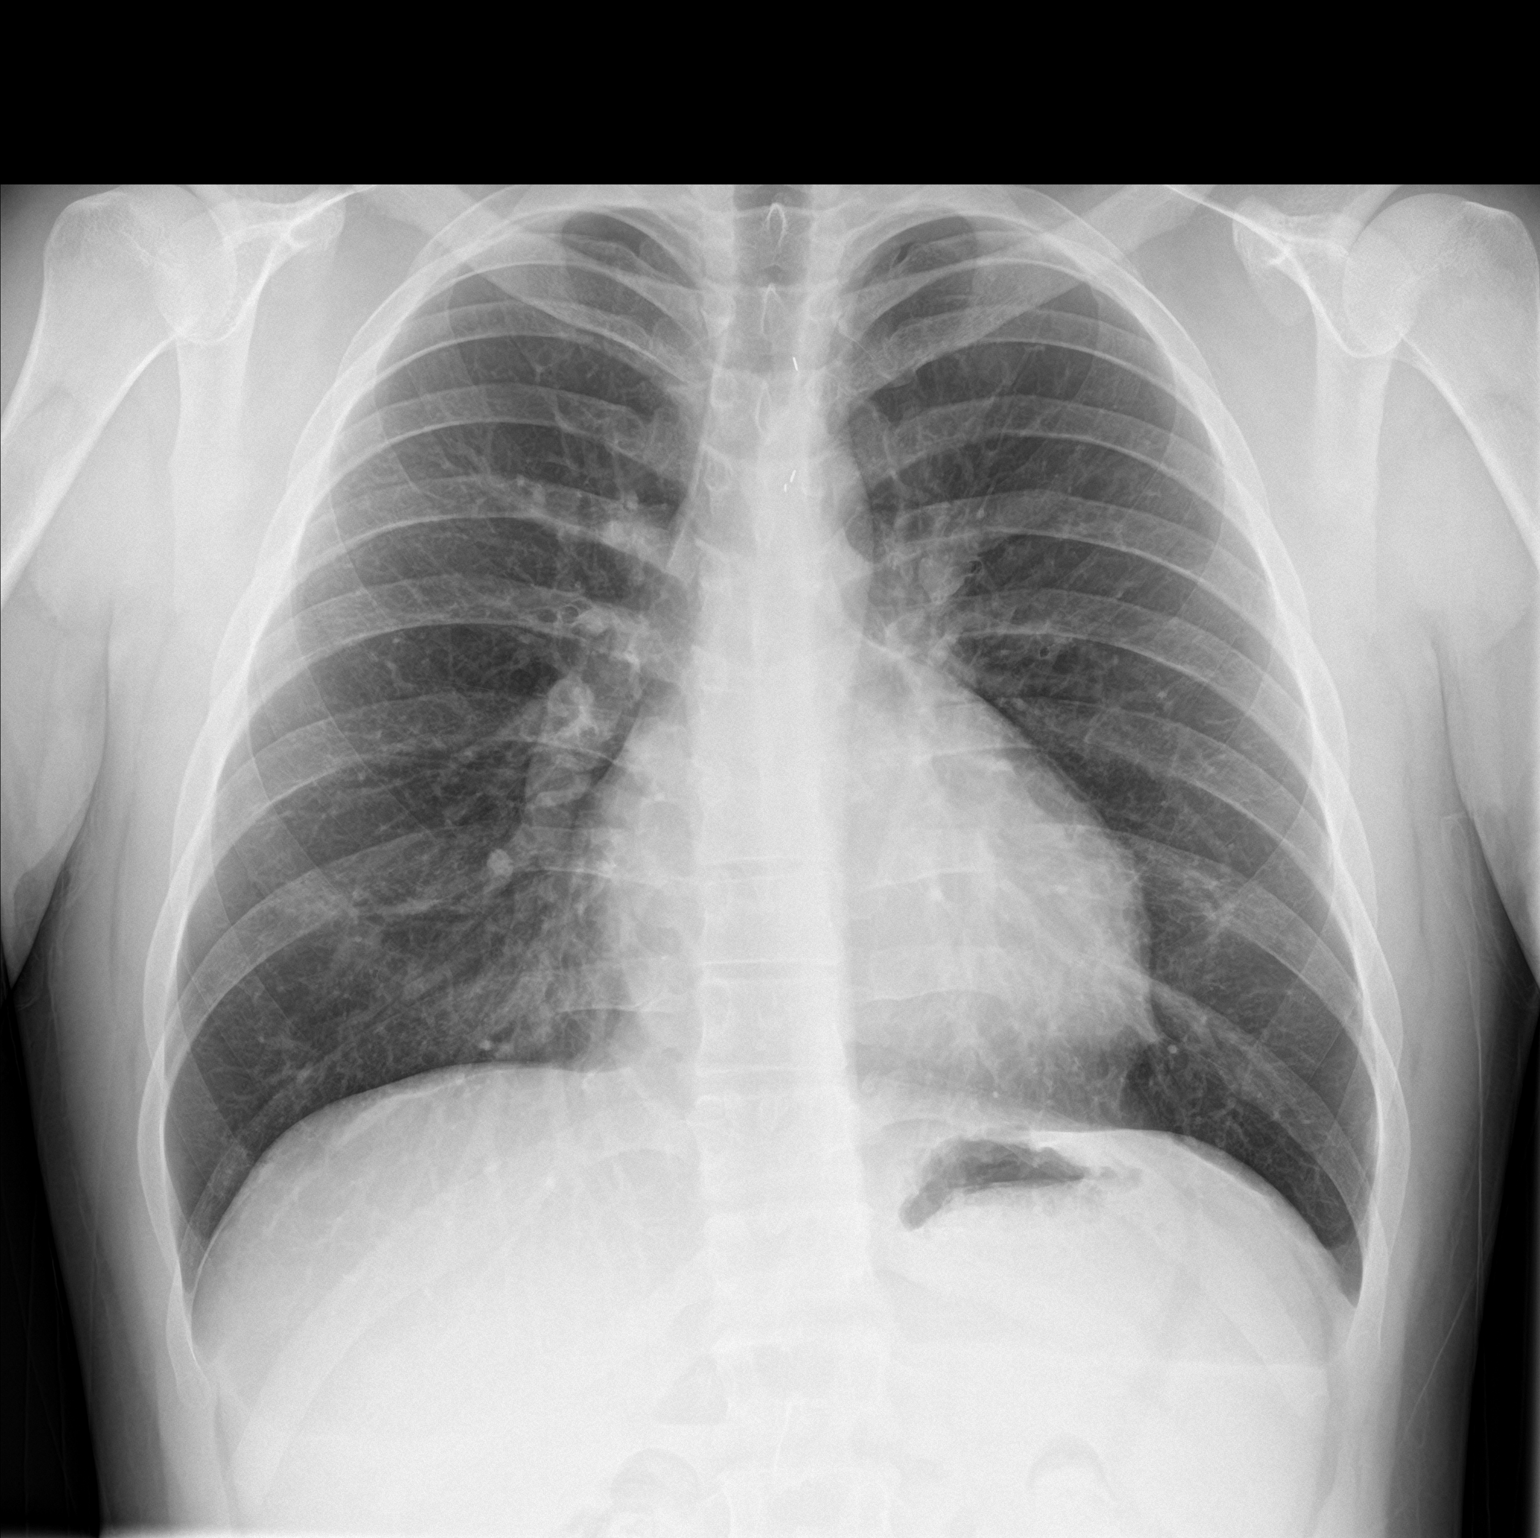

[chest lat]
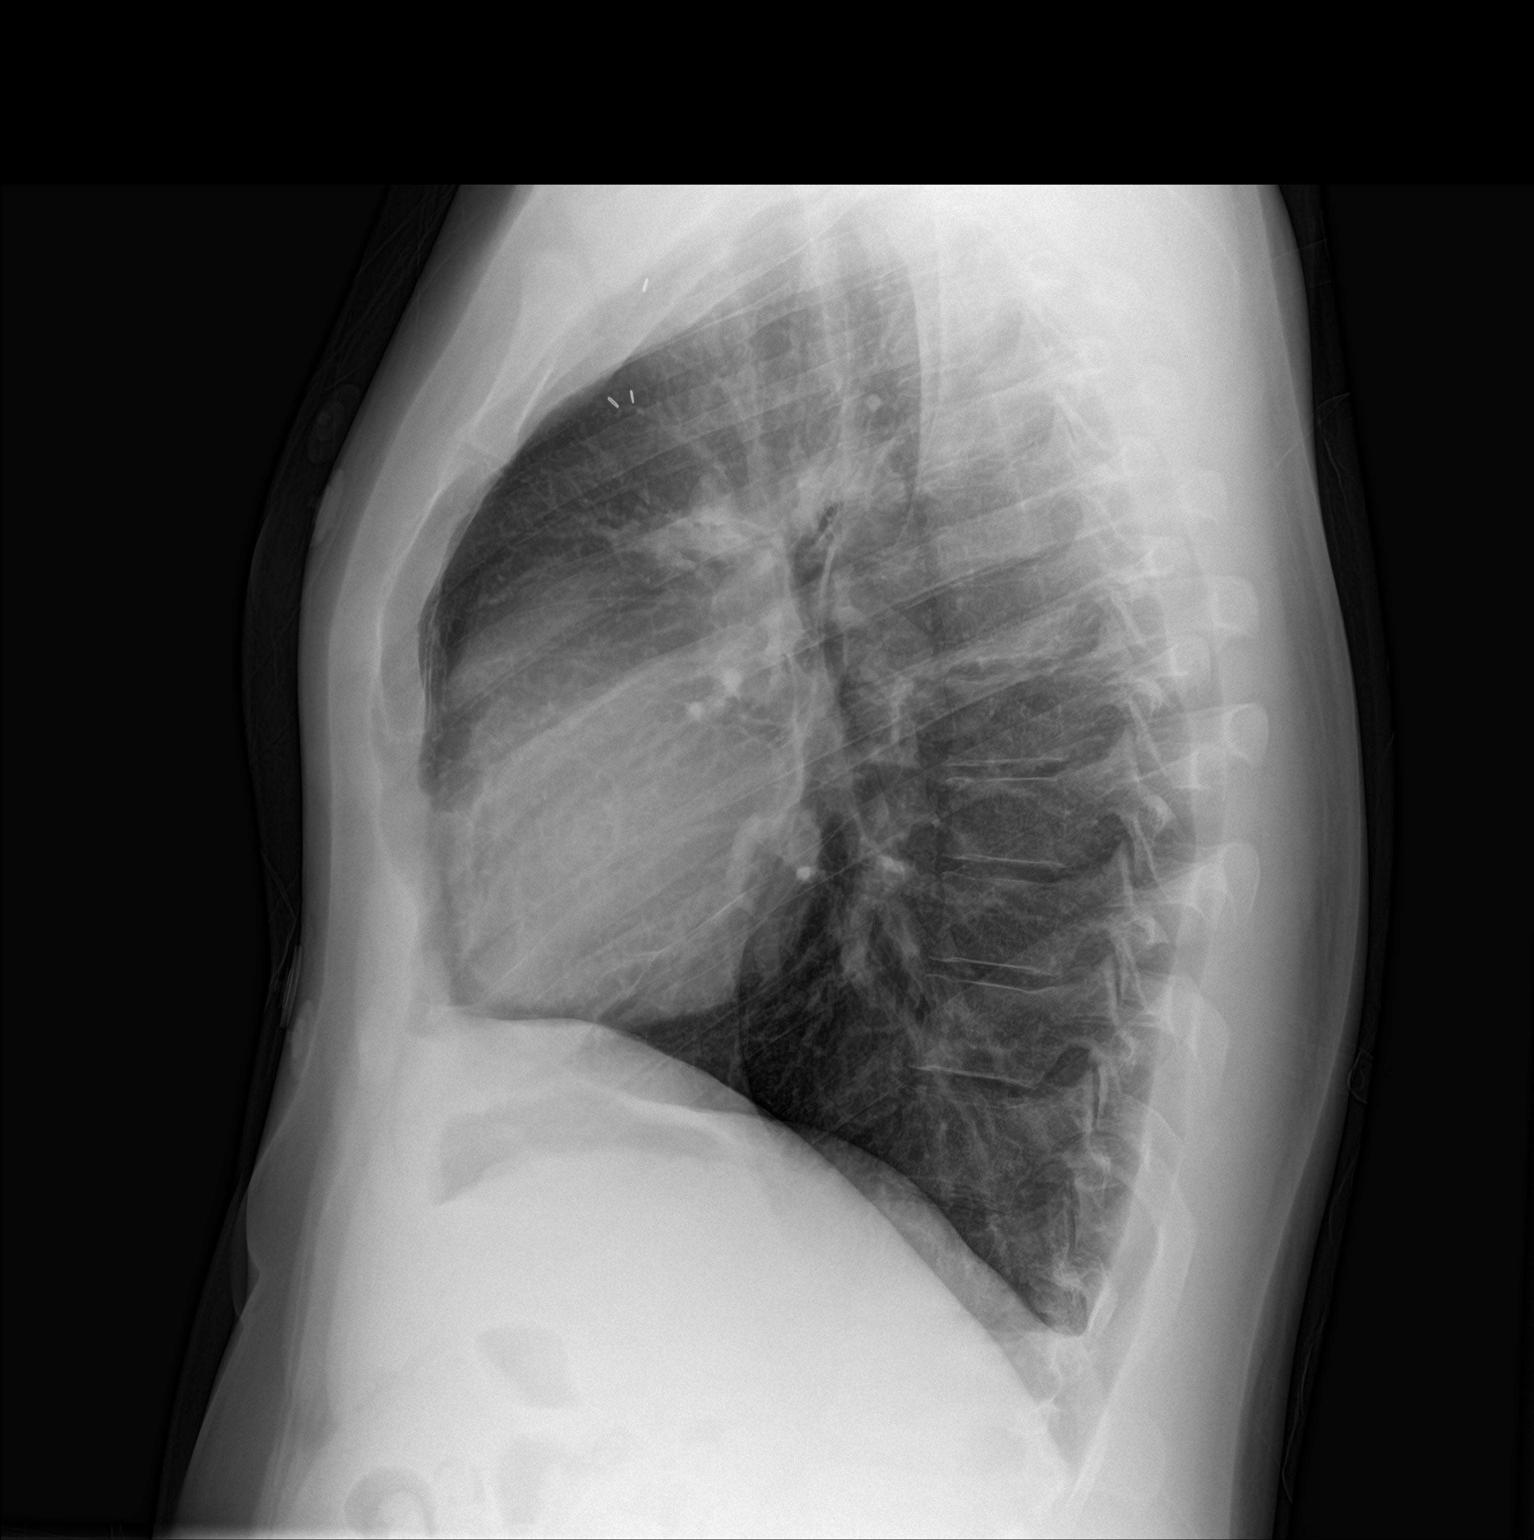

[2 of 2 positions shown; findings below may reference images not displayed]

FINDINGS: The lungs are well-aerated and clear. There is no evidence of focal
opacification, pleural effusion or pneumothorax.

The heart is normal in size; the mediastinal contour is within
normal limits. No acute osseous abnormalities are seen.
IMPRESSION: No acute cardiopulmonary process seen.

## 2017-11-20 ENCOUNTER — Encounter: Payer: Self-pay | Admitting: Family Medicine

## 2017-11-20 ENCOUNTER — Ambulatory Visit: Payer: BLUE CROSS/BLUE SHIELD | Admitting: Family Medicine

## 2017-11-20 VITALS — BP 118/78 | HR 66 | Temp 98.4°F | Wt 174.0 lb

## 2017-11-20 DIAGNOSIS — Z13220 Encounter for screening for lipoid disorders: Secondary | ICD-10-CM

## 2017-11-20 DIAGNOSIS — J029 Acute pharyngitis, unspecified: Secondary | ICD-10-CM | POA: Diagnosis not present

## 2017-11-20 LAB — LIPID PANEL
CHOLESTEROL: 71 mg/dL (ref 0–200)
HDL: 37.8 mg/dL — ABNORMAL LOW (ref >39.00)
LDL Cholesterol: 26 mg/dL (ref 0–99)
NonHDL: 33.67
TRIGLYCERIDES: 40 mg/dL (ref 0.0–149.0)
Total CHOL/HDL Ratio: 2
VLDL: 8 mg/dL (ref 0.0–40.0)

## 2017-11-20 NOTE — Patient Instructions (Signed)
Consider vitamin D and vitamin B12 supplement daily.  Labs today to check cholesterol. Throat's looking ok.  Trial zantac 150mg  once a day to see if any improvement in throat pain.  If not better with this, may use ibuprofen 400mg  with meals for throat inflammation.  Salt water gargles.  Suck on cold things like popsicles or warm things like herbal teas (whichever soothes the throat better).  Return if fever >101.5, worsening pain, or trouble opening/closing mouth, or hoarse voice. Good to see you today, call clinic with questions.

## 2017-11-20 NOTE — Assessment & Plan Note (Signed)
Benign exam today. ?viral pharyngitis. Supportive care reviewed. I don't think he needs ENT referral at this time, but did discuss that he could call and f/u if needed (last seen ~01/2017.  As worse with citrus, possible hidden reflux component. I suggested trial zantac 150mg  daily and if no better then try NSAID. Further supportive care as per instructions.

## 2017-11-20 NOTE — Progress Notes (Signed)
BP 118/78 (BP Location: Left Arm, Patient Position: Sitting, Cuff Size: Normal)   Pulse 66   Temp 98.4 F (36.9 C) (Oral)   Wt 174 lb (78.9 kg)   SpO2 98%   BMI 27.25 kg/m    CC: ST Subjective:    [name], male    DOB: [birth year], 26 y.o.   MRN: [id number]  HPI: Danny Russo is a 26 y.o. male presenting on 11/20/2017 for Sore Throat (about 2 wks. Not taking anything. Denies any other sxs)   2-3 wk h/o left ST worse with swallow. He thinks he also has some right sided chronic nasal obstruction for years.   No fevers/chills, ear or tooth pain, nasal congestion, coughing.   Tried ibuprofen which helped temporarily.  Denies allergic rhinitis Denies GERD sxs but he notes that citrus tends to irritate throat more.  H/o ST in the past - has seen ENT Dr Ezzard StandingNewman - with normal evaluation. No records available.  Ex smoker - quit 2015. Has stopped recreational drugs. Has stopped alcohol over last 3 months.  Interested in plant based diet and overall working towards healthy lifestyle.   Oh by the way - requests labwork today.   Relevant past medical, surgical, family and social history reviewed and updated as indicated. Interim medical history since our last visit reviewed. Allergies and medications reviewed and updated. Outpatient Medications Prior to Visit  Medication Sig Dispense Refill  . aspirin 81 MG tablet Take 81 mg by mouth as needed for pain.     No facility-administered medications prior to visit.      Per HPI unless specifically indicated in ROS section below Review of Systems     Objective:    BP 118/78 (BP Location: Left Arm, Patient Position: Sitting, Cuff Size: Normal)   Pulse 66   Temp 98.4 F (36.9 C) (Oral)   Wt 174 lb (78.9 kg)   SpO2 98%   BMI 27.25 kg/m   Wt Readings from Last 3 Encounters:  11/20/17 174 lb (78.9 kg)  06/12/17 172 lb (78 kg)  05/08/17 165 lb (74.8 kg)    Physical Exam  Constitutional: He appears  well-developed and well-nourished. No distress.  HENT:  Head: Normocephalic and atraumatic.  Right Ear: Hearing, tympanic membrane, external ear and ear canal normal.  Left Ear: Hearing, tympanic membrane, external ear and ear canal normal.  Nose: Nose normal. No mucosal edema or rhinorrhea. Right sinus exhibits no maxillary sinus tenderness and no frontal sinus tenderness. Left sinus exhibits no maxillary sinus tenderness and no frontal sinus tenderness.  Mouth/Throat: Uvula is midline, oropharynx is clear and moist and mucous membranes are normal. No oropharyngeal exudate, posterior oropharyngeal edema, posterior oropharyngeal erythema or tonsillar abscesses.  No significant nasal obstruction noted today.   Eyes: Conjunctivae and EOM are normal. Pupils are equal, round, and reactive to light. No scleral icterus.  Neck: Normal range of motion. Neck supple. No thyromegaly present.  Cardiovascular: Normal rate, regular rhythm, normal heart sounds and intact distal pulses.  No murmur heard. Pulmonary/Chest: Effort normal and breath sounds normal. No respiratory distress. He has no wheezes. He has no rales.  Lymphadenopathy:    He has no cervical adenopathy.  Skin: Skin is warm and dry. No rash noted.  Nursing note and vitals reviewed.      Assessment & Plan:   Problem List Items Addressed This Visit    Sore throat - Primary    Benign exam today. ?viral pharyngitis. Supportive  care reviewed. I don't think he needs ENT referral at this time, but did discuss that he could call and f/u if needed (last seen ~01/2017.  As worse with citrus, possible hidden reflux component. I suggested trial zantac 150mg  daily and if no better then try NSAID. Further supportive care as per instructions.        Other Visit Diagnoses    Lipid screening       Relevant Orders   Lipid panel       Follow up plan: Return if symptoms worsen or fail to improve.  Eustaquio BoydenJavier Tekila Caillouet, MD

## 2018-07-09 ENCOUNTER — Encounter: Payer: Self-pay | Admitting: Family Medicine

## 2018-07-09 ENCOUNTER — Ambulatory Visit: Payer: BLUE CROSS/BLUE SHIELD | Admitting: Family Medicine

## 2018-07-09 ENCOUNTER — Ambulatory Visit: Payer: BLUE CROSS/BLUE SHIELD | Admitting: Internal Medicine

## 2018-07-09 VITALS — BP 116/70 | HR 68 | Temp 97.8°F | Ht 68.25 in | Wt 168.8 lb

## 2018-07-09 DIAGNOSIS — R21 Rash and other nonspecific skin eruption: Secondary | ICD-10-CM | POA: Diagnosis not present

## 2018-07-09 MED ORDER — TRIAMCINOLONE ACETONIDE 0.1 % EX CREA: 1.0000 "application " | TOPICAL_CREAM | Freq: Two times a day (BID) | CUTANEOUS | 1 refills | Status: DC

## 2018-07-09 NOTE — Assessment & Plan Note (Signed)
Anticipate heat rash - discussed this as well as management strategies of staying in cool place especially ensure cool well conditioned environment to sleep. Rx TCI cream PRN. Update if not improving with treatment. Not consistent with scabies. R/o bed bugs

## 2018-07-09 NOTE — Patient Instructions (Addendum)
Take ibuprofen 400-600mg  with meals for the next week for wisdom tooth pain.  I think rash may be heat rash. Stay cool, avoid highly fragranced products (detergents, soaps, shampoos). Try steroid cream to itchy spots.  Check mattress at home.   Heat Rash, Adult Heat rash is an itchy rash of little red bumps that often occurs during hot, humid weather. Heat rash is also called prickly heat or miliaria. Heat rash usually affects:  Armpits.  Elbows.  Groin.  Neck.  The area underneath the breasts.  Shoulders.  Chest.  What are the causes? This condition is caused by blocked sweat ducts. When sweat is trapped under the skin, it spreads into surrounding tissues and causes a rash of red bumps. What increases the risk? This condition is more likely to develop in people who:  Are overdressed in hot, humid weather.  Wear clothing that rubs against the skin.  Are active in hot, humid weather.  Sweat a lot.  Are not used to hot, humid weather.  What are the signs or symptoms? Symptoms of this condition include:  Small red bumps that are itchy or prickly.  Very little sweating or no sweating in the affected area.  How is this diagnosed? This condition is diagnosed based on your symptoms and medical history, as well as a physical exam. How is this treated? Moving to a cool, dry place is the best treatment for heat rash. Treatment may also include medicines, such as:  Corticosteroid creams for skin irritation.  Antibiotic medicines, if the rash becomes infected.  Follow these instructions at home: Skin care  Keep the affected area dry.  Do not apply ointments or creams that contain mineral oil or petroleum ingredients to your skin. These can make the condition worse.  Apply cool compresses to the affected areas.  Do not scratch your skin.  Do not take hot showers or baths. General instructions  Take over-the-counter and prescription medicines only as told by your  health care provider.  If you were prescribed an antibiotic, take it as told by your health care provider. Do not stop taking it even if your condition improves.  Stay in a cool room as much as possible. Use an air conditioner or fan, if possible.  Do not wear tight clothes. Wear comfortable, loose-fitting clothing.  Keep all follow-up visits as told by your health care provider. This is important. Contact a health care provider if:  You have a fever.  Your rash does not go away after 3-4 days.  Your rash gets worse or it is very itchy.  Your rash has pus or fluid coming from it. Get help right away if:  You are dizzy or nauseated.  You feel confused.  You have trouble breathing.  You have chest pain.  You have muscle cramps or contractions.  You faint. Summary  Heat rash is an itchy rash of little red bumps that often occurs during hot, humid weather.  Symptoms of heat rash include small red bumps that are itchy or prickly and very little or no sweating in the affected area.  This condition is diagnosed based on your symptoms and medical history, as well as a physical exam.  Moving to a cool, dry place is the best treatment for heat rash.  Do not wear tight clothes. Wear comfortable, loose-fitting clothing. This information is not intended to replace advice given to you by your health care provider. Make sure you discuss any questions you have with your health care  provider. Document Released: 11/06/2009 Document Revised: 01/29/2017 Document Reviewed: 01/29/2017 Elsevier Interactive Patient Education  [employer]2018 Elsevier Inc.

## 2018-07-09 NOTE — Progress Notes (Signed)
   BP 116/70 (BP Location: Left Arm, Patient Position: Sitting, Cuff Size: Normal)   Pulse 68   Temp 97.8 F (36.6 C) (Oral)   Ht 5' 8.25" (1.734 m)   Wt 168 lb 12 oz (76.5 kg)   SpO2 98%   BMI 25.47 kg/m    CC: rash Subjective:    [name], male    DOB: [birth year], 27 y.o.   MRN: [id number]  HPI: Danny Russo is a 27 y.o. male presenting on 07/09/2018 for Rash (C/o rash located on chest and back. Says area itches when he gets hot. Noticed 2 days ago. )   2d h/o itchy rash noted on torso, worse with heat. Describes itchy bumps that develop on front and back.   Hasn't tried anything for this in the past.  No new lotions, detergents, soaps or shampoos. No new medicines or foods.  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  He stayed at fried's house recently.  Recent clinda course for infected lower right wisdom tooth.  Needs cardiac clearance prior - pending appt for end of month.   Relevant past medical, surgical, family and social history reviewed and updated as indicated. Interim medical history since our last visit reviewed. Allergies and medications reviewed and updated. No outpatient medications prior to visit.   No facility-administered medications prior to visit.      Per HPI unless specifically indicated in ROS section below Review of Systems     Objective:    BP 116/70 (BP Location: Left Arm, Patient Position: Sitting, Cuff Size: Normal)   Pulse 68   Temp 97.8 F (36.6 C) (Oral)   Ht 5' 8.25" (1.734 m)   Wt 168 lb 12 oz (76.5 kg)   SpO2 98%   BMI 25.47 kg/m   Wt Readings from Last 3 Encounters:  07/09/18 168 lb 12 oz (76.5 kg)  11/20/17 174 lb (78.9 kg)  06/12/17 172 lb (78 kg)    Physical Exam  Constitutional: He appears well-developed and well-nourished. No distress.  Skin: Skin is warm and dry. Rash noted. No erythema.  Small papules throughout trunk - chest and upper back, pruritic, mild erythema with this. No macules or  vesicles   Nursing note and vitals reviewed.     Assessment & Plan:   Problem List Items Addressed This Visit    Skin rash - Primary    Anticipate heat rash - discussed this as well as management strategies of staying in cool place especially ensure cool well conditioned environment to sleep. Rx TCI cream PRN. Update if not improving with treatment. Not consistent with scabies. R/o bed bugs           Meds ordered this encounter  Medications  . triamcinolone cream (KENALOG) 0.1 %    Sig: Apply 1 application topically 2 (two) times daily. Apply to AA.    Dispense:  60 g    Refill:  1   No orders of the defined types were placed in this encounter.   Follow up plan: No follow-ups on file.  Eustaquio BoydenJavier Jonisha Kindig, MD

## 2018-07-30 ENCOUNTER — Ambulatory Visit: Payer: BLUE CROSS/BLUE SHIELD | Admitting: Cardiovascular Disease

## 2018-07-30 ENCOUNTER — Encounter: Payer: Self-pay | Admitting: *Deleted

## 2018-07-30 ENCOUNTER — Encounter

## 2018-07-30 ENCOUNTER — Encounter: Payer: Self-pay | Admitting: Cardiovascular Disease

## 2018-07-30 VITALS — BP 110/60 | HR 63 | Ht 68.0 in | Wt 172.8 lb

## 2018-07-30 DIAGNOSIS — R079 Chest pain, unspecified: Secondary | ICD-10-CM | POA: Diagnosis not present

## 2018-07-30 NOTE — Progress Notes (Signed)
Cardiology Office Note  Date:  07/30/2018   ID:  Danny Russo, DOB 12-06-1990, MRN [id number]  PCP:  Danny BoydenGutierrez, Javier, MD   Chief Complaint  Patient presents with  . other    F/u ED chest pain pt needing cardiac clearance dental procedure. Meds reviewed verbally with pt.     HPI:  [name] is a 27 y.o. 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 and chest pain  In follow-up reports that he is doing well Denies any significant chest pain No recent trips to the emergency room  Echocardiogram 2017 and 2018 reviewed with him in detail Mildly dilated aorta, stable Aortic root 3.9 cm, up from 3.5 cm in 2016 Normal cardiac function  EKG personally reviewed by myself on todays visit  shows no sinus rhythm with rate 58 bpm, no significant ST or T-wave changes  Other past medical history US ECHOCARDIOGRAPHY Date: 05/2011 normal bi vent size/fxn, normal flow across all valves, subsystemic R vent pressure, patent aortic arch, no pericardial effusion    PMH:   has a past medical history of Alcohol use, History of smoking, History of venomous snake bite, 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  PSH:    Past Surgical History:  Procedure Laterality Date  . holter  06/2011   rare PAC, PVC  . scrotal us  01/2012   1.3cm epididymal cyst on left, o/w normal  . TRANSPOSITION OF GREAT VESSELS REPAIR     infant  . US ECHOCARDIOGRAPHY  05/2011   normal bi vent size/fxn, normal flow across all valves, subsystemic R vent pressure, patent aortic arch, no pericardial effusion    Current Outpatient Medications  Medication Sig Dispense Refill  . CLINDAMYCIN HCL PO Take by mouth as  needed.    . triamcinolone cream (KENALOG) 0.1 % Apply 1 application topically 2 (two) times daily. Apply to AA. 60 g 1   No current facility-administered medications for this visit.      Allergies:   Patient has no known allergies.   Social History:  The patient  reports that he quit smoking about 3 years ago. His smoking use included cigarettes. He has a 1.00 pack-year smoking history. He has quit using smokeless tobacco.  His smokeless tobacco use included snuff. He reports that he drinks alcohol. He reports that he does not use drugs.   Family History:   family history includes Cancer in his maternal uncle; Coronary artery disease (age of onset: 2060) in his maternal uncle; Diabetes in his maternal grandmother.    Review of Systems: Review of Systems  Constitutional: Negative.   Respiratory: Negative.   Cardiovascular: Positive for chest pain.  Gastrointestinal: Negative.   Musculoskeletal: Negative.   Neurological: Negative.   Psychiatric/Behavioral: Negative.   All other systems reviewed and are negative.    PHYSICAL EXAM: VS:  BP 110/60 (BP Location: Left Arm, Patient Position: Sitting, Cuff Size: Normal)   Pulse 63   Ht 5\' 8"  (1.727 m)   Wt 172 lb 12 oz (78.4 kg)   BMI 26.27 kg/m  , BMI Body mass index is 26.27 kg/m. Constitutional:  oriented to person, place, and time. No distress.  HENT:  Head: Normocephalic and atraumatic.  Eyes:  no discharge. No scleral icterus.  Neck: Normal range of motion. Neck supple. No JVD present.  Cardiovascular: Normal rate, regular rhythm, normal heart sounds and intact distal pulses. Exam reveals no gallop and no friction rub. No edema 2/6 SEM LSB/RSB Pulmonary/Chest: Effort normal and breath sounds normal. No stridor. No respiratory distress.  no wheezes.  no rales.  no tenderness.  Abdominal: Soft.  no distension.  no tenderness.  Musculoskeletal: Normal range of motion.  no  tenderness or deformity.  Neurological:  normal muscle  tone. Coordination normal. No atrophy Skin: Skin is warm and dry. No rash noted. not diaphoretic.  Psychiatric:  normal mood and affect. behavior is normal. Thought content normal.    Recent Labs: No results found for requested labs within last 8760 hours.    Lipid Panel Lab Results  Component Value Date   CHOL 71 11/20/2017   HDL 37.80 (L) 11/20/2017   LDLCALC 26 11/20/2017   TRIG 40.0 11/20/2017      Wt Readings from Last 3 Encounters:  07/30/18 172 lb 12 oz (78.4 kg)  07/09/18 168 lb 12 oz (76.5 kg)  11/20/17 174 lb (78.9 kg)       ASSESSMENT AND PLAN:  Transposition of great arteries, corrected - Plan: EKG 12-Lead Stable murmur Previous echocardiogram Apr 22, 2016 in Apr 22, 2017 He has declined repeat echocardiogram at this time Reports it was very expensive and not covered by insurance in Apr 22, 2017, uncertain if this was coded incorrectly or he had to Dr. 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    Signed, Dossie Arbour, M.D., Ph.D. 07/30/2018  [facility] [place], [place] [phone]

## 2018-07-30 NOTE — Patient Instructions (Signed)

## 2018-11-12 NOTE — Progress Notes (Signed)
BP 118/70 (BP Location: Left Arm, Patient Position: Sitting, Cuff Size: Normal)   Pulse 78   Temp 98.3 F (36.8 C) (Oral)   Ht 5' 8.25" (1.734 m)   Wt 181 lb 12 oz (82.4 kg)   SpO2 98%   BMI 27.43 kg/m    CC: R knee pain Subjective:    [name], male    DOB: 02-06-1991, 27 y.o.   MRN: [id number]  HPI: KALON ERHARDT is a 27 y.o. male presenting on 11/13/2018 for Knee Pain (C/o right knee pain. Thinks it may be due to an injury. Feels pain after running. Tried ibuprofen, helpful. )   Has been exercising more regularly - running several miles a week, playing soccer, may have injured R knee by over-exercising. Pain started 2 months ago. Initially anterior knee as site of pain. Now more lateral knee pain. Ibuprofen helps but doesn't like to use.   Relevant past medical, surgical, family and social history reviewed and updated as indicated. Interim medical history since our last visit reviewed. Allergies and medications reviewed and updated. Outpatient Medications Prior to Visit  Medication Sig Dispense Refill  . CLINDAMYCIN HCL PO Take by mouth as needed.    . triamcinolone cream (KENALOG) 0.1 % Apply 1 application topically 2 (two) times daily. Apply to AA. 60 g 1   No facility-administered medications prior to visit.      Per HPI unless specifically indicated in ROS section below Review of Systems     Objective:    BP 118/70 (BP Location: Left Arm, Patient Position: Sitting, Cuff Size: Normal)   Pulse 78   Temp 98.3 F (36.8 C) (Oral)   Ht 5' 8.25" (1.734 m)   Wt 181 lb 12 oz (82.4 kg)   SpO2 98%   BMI 27.43 kg/m   Wt Readings from Last 3 Encounters:  11/13/18 181 lb 12 oz (82.4 kg)  07/30/18 172 lb 12 oz (78.4 kg)  07/09/18 168 lb 12 oz (76.5 kg)    Physical Exam Vitals signs and nursing note reviewed.  Constitutional:      General: He is not in acute distress.    Appearance: Normal appearance.  Musculoskeletal:     Comments: L knee WNL R  knee exam: No deformity on inspection.  No significant pain with palpation of knee landmarks.  No effusion/swelling noted. FROM in flex/extension with some popping with full extension.  No popliteal fullness. Neg drawer test. Neg mcmurray test. No pain with valgus/varus stress. + mild PFgrind on left. No abnormal patellar mobility.   Neurological:     Mental Status: He is alert.       Assessment & Plan:   Problem List Items Addressed This Visit    Right anterior knee pain - Primary    Anticipate PFPS or runner's knee. Discussed with patient. rec supportive care, provided with exercises from [facility] pt advisor. Update if not improving with treatment for formal PT referral.           No orders of the defined types were placed in this encounter.  No orders of the defined types were placed in this encounter.   Follow up plan: Return if symptoms worsen or fail to improve.  Patient Instructions  I think you have runner's knee (patellofemoral pain syndrome).  Do exercises provided today. Rest leg, elevate when sitting for a long time, consider knee sleeve brace for running.  Take ibuprofen 400-600mg  2-3 times daily with meals for 5  days.  Google pubmed.    Eustaquio BoydenJavier Raykwon Hobbs, MD

## 2018-11-13 ENCOUNTER — Ambulatory Visit: Payer: BLUE CROSS/BLUE SHIELD | Admitting: Family Medicine

## 2018-11-13 ENCOUNTER — Encounter: Payer: Self-pay | Admitting: Family Medicine

## 2018-11-13 VITALS — BP 118/70 | HR 78 | Temp 98.3°F | Ht 68.25 in | Wt 181.8 lb

## 2018-11-13 DIAGNOSIS — M25561 Pain in right knee: Secondary | ICD-10-CM

## 2018-11-13 NOTE — Patient Instructions (Signed)
I think you have runner's knee (patellofemoral pain syndrome).  Do exercises provided today. Rest leg, elevate when sitting for a long time, consider knee sleeve brace for running.  Take ibuprofen 400-600mg  2-3 times daily with meals for 5 days.  Google pubmed.

## 2018-11-13 NOTE — Assessment & Plan Note (Signed)
Anticipate PFPS or runner's knee. Discussed with patient. rec supportive care, [facility]. Update if not improving with treatment for formal PT referral.

## 2019-06-09 ENCOUNTER — Telehealth: Payer: Self-pay | Admitting: Cardiovascular Disease

## 2019-06-09 NOTE — Telephone Encounter (Signed)
Spoke with the pt. Pt sts that he recently went to the dentist and had some dental work done.  He will need additional dental work in the near future and wants to know if her needs to have an antibiotic prior. Pt has a hx of congenital  transposition of great arteries, that is corrected. Pt has no hx of valve replacement or endocarditis. Adv the pt that I do not think that antibiotic prophylaxis is indicated, but I will fwd the message to Dr.Gollan to advise. Pt verbalized understanding.

## 2019-06-09 NOTE — Telephone Encounter (Signed)
Patient is having some dental work and would like to discuss if he needs to have a antibiotic. Please call.

## 2019-06-12 NOTE — Telephone Encounter (Signed)
It might be safe to proceed without ABX but we do not have his surgical report with all of the details. Safest thing may be to take dose of amoxacillin 2000 mg x 1,  2 hours before procedure , if no PCN allergy

## 2019-06-14 MED ORDER — AMOXICILLIN 500 MG PO CAPS: ORAL_CAPSULE | ORAL | 2 refills | Status: DC

## 2019-06-14 NOTE — Telephone Encounter (Signed)
Patient returning voicemail, his best call back number is [phone]

## 2019-06-14 NOTE — Telephone Encounter (Signed)
Spoke with the pt. Pt made aware of Dr.Gollan's response. Rx sent to the pt pharmacy.

## 2019-06-14 NOTE — Telephone Encounter (Signed)
Called to give the pt Dr.Gollan's recommendation. [email or web address]. 

## 2019-06-29 ENCOUNTER — Encounter: Payer: Self-pay | Admitting: Family Medicine

## 2019-06-29 ENCOUNTER — Ambulatory Visit (INDEPENDENT_AMBULATORY_CARE_PROVIDER_SITE_OTHER): Payer: BLUE CROSS/BLUE SHIELD | Admitting: Family Medicine

## 2019-06-29 VITALS — BP 118/68 | Temp 97.5°F | Ht 68.25 in | Wt 168.0 lb

## 2019-06-29 DIAGNOSIS — J029 Acute pharyngitis, unspecified: Secondary | ICD-10-CM | POA: Diagnosis not present

## 2019-06-29 DIAGNOSIS — R05 Cough: Secondary | ICD-10-CM | POA: Diagnosis not present

## 2019-06-29 DIAGNOSIS — Q205 Discordant atrioventricular connection: Secondary | ICD-10-CM | POA: Diagnosis not present

## 2019-06-29 DIAGNOSIS — Z13220 Encounter for screening for lipoid disorders: Secondary | ICD-10-CM | POA: Diagnosis not present

## 2019-06-29 DIAGNOSIS — R059 Cough, unspecified: Secondary | ICD-10-CM | POA: Insufficient documentation

## 2019-06-29 MED ORDER — AZITHROMYCIN 250 MG PO TABS: ORAL_TABLET | ORAL | 0 refills | Status: DC

## 2019-06-29 MED ORDER — BENZONATATE 100 MG PO CAPS: 100.0000 mg | ORAL_CAPSULE | Freq: Three times a day (TID) | ORAL | 0 refills | Status: DC | PRN

## 2019-06-29 MED ORDER — ALBUTEROL SULFATE HFA 108 (90 BASE) MCG/ACT IN AERS: 2.0000 | INHALATION_SPRAY | Freq: Four times a day (QID) | RESPIRATORY_TRACT | 1 refills | Status: DC | PRN

## 2019-06-29 NOTE — Assessment & Plan Note (Signed)
Anticipate acute bronchitis with post infectious cough. Fortunately did have negative covid test 2 wks ago and symptoms don't sound consistent with Covid infection. Supportive care reviewed - will Rx tessalon perls, albuterol inhaler (with precautions), WASP for azithromycin with indications when to fill. Pt agrees with plan.  Will schedule labs and CPE in the next few weeks.

## 2019-06-29 NOTE — Progress Notes (Addendum)
Virtual visit completed through [employer]. Due to national recommendations of social distancing due to COVID-19, a virtual visit is felt to be most appropriate for this patient at this time. Reviewed limitations of a virtual visit.   Patient location: home Provider location: [place] at [facility], office If any vitals were documented, they were collected by patient at home unless specified below.    BP 118/68   Temp (!) 97.5 F (36.4 C)   Ht 5' 8.25" (1.734 m)   Wt 168 lb (76.2 kg)   BMI 25.36 kg/m    CC: cough Subjective:    [name], male    DOB: [birth year], 28 y.o.   MRN: [id number]  HPI: Danny Russo is a 28 y.o. male presenting on 06/29/2019 for Cough (C/o cough and now some SOB.  Also, c/o wheezing and hears crackling in chest. Cough started about 2 wks ago. Neg COVID test at done [facility] on 06/17/19. Tried natural remedies.  Concerned due to h/o heart condition. )   2 wk h/o cough productive of yellow mucous, wheezing, mild dyspnea. Cough worse first thing in the morning and with meals. Some upper back tightness. Mild diarrhea. Staying well hydrated.  Has tried OTC mucous relief DM with some benefit.  Denies fevers/chills, congestion, headache, body aches, abd pain, nausea or loss of taste/smell.   Ongoing L sided sore throat - ongoing for years, has seen ENT. 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   He has been making significant healthy diet and lifestyle changes - limiting carbs, eating fruits/vegetables, lots of bicycling.   Did have negative Covid test done at [facility] 06/17/2019 at onset of illness.   No h/o asthma. Ex smoker quit 2015.  H/o repair of transposition of great vessels as infant.   Recent dental cleaning 06/08/2019, did take 2 gm amoxicillin. Was worried about infective endocarditis.  He did recently do bowel cleanse with probiotic, magnesium, digestive enzymes.      Relevant past medical, surgical,  family and social history reviewed and updated as indicated. Interim medical history since our last visit reviewed. Allergies and medications reviewed and updated. Outpatient Medications Prior to Visit  Medication Sig Dispense Refill  . amoxicillin (AMOXIL) 500 MG capsule Take 4 tablets (2,000mg ) 2 hours prior to any dental procedure. 4 capsule 2   No facility-administered medications prior to visit.      Per HPI unless specifically indicated in ROS section below Review of Systems Objective:    BP 118/68   Temp (!) 97.5 F (36.4 C)   Ht 5' 8.25" (1.734 m)   Wt 168 lb (76.2 kg)   BMI 25.36 kg/m   Wt Readings from Last 3 Encounters:  06/29/19 168 lb (76.2 kg)  11/13/18 181 lb 12 oz (82.4 kg)  07/30/18 172 lb 12 oz (78.4 kg)     Physical exam: Gen: alert, NAD, not ill appearing Pulm: speaks in complete sentences without increased work of breathing, deep cough intermittently present Psych: normal mood, normal thought content      Results for orders placed or performed in visit on 11/20/17  Lipid panel  Result Value Ref Range   Cholesterol 71 0 - 200 mg/dL   Triglycerides 40.0 0.0 - 149.0 mg/dL   HDL 37.80 (L) >39.00 mg/dL   VLDL 8.0 0.0 - 40.0 mg/dL   LDL Cholesterol 26 0 - 99 mg/dL   Total CHOL/HDL Ratio 2    NonHDL 33.67  Assessment & Plan:  Reassurance provided regarding concerns with infective endocarditis as well as hashimoto thyroiditis.  Problem List Items Addressed This Visit    Cough - Primary    Anticipate acute bronchitis with post infectious cough. Fortunately did have negative covid test 2 wks ago and symptoms don't sound consistent with Covid infection. Supportive care reviewed - will Rx tessalon perls, albuterol inhaler (with precautions), WASP for azithromycin with indications when to fill. Pt agrees with plan.  Will schedule labs and CPE in the next few weeks.       Relevant Orders   Comprehensive metabolic panel   CBC with Differential/Platelet    Congenitally corrected transposition of great arteries    Other Visit Diagnoses    Sore throat       Relevant Orders   TSH   T4, free   Lipid screening       Relevant Orders   Lipid panel       Meds ordered this encounter  Medications  . albuterol (VENTOLIN HFA) 108 (90 Base) MCG/ACT inhaler    Sig: Inhale 2 puffs into the lungs every 6 (six) hours as needed for wheezing or shortness of breath.    Dispense:  18 g    Refill:  1    Formulate based on pt/insurance affordability  . benzonatate (TESSALON) 100 MG capsule    Sig: Take 1 capsule (100 mg total) by mouth 3 (three) times daily as needed for cough.    Dispense:  30 capsule    Refill:  0  . azithromycin (ZITHROMAX) 250 MG tablet    Sig: Take two tablets on day one followed by one tablet on days 2-5    Dispense:  6 each    Refill:  0   Orders Placed This Encounter  Procedures  . Lipid panel    Standing Status:   Future    Standing Expiration Date:   06/28/2020  . Comprehensive metabolic panel    Standing Status:   Future    Standing Expiration Date:   06/28/2020  . TSH    Standing Status:   Future    Standing Expiration Date:   06/28/2020  . T4, free    Standing Status:   Future    Standing Expiration Date:   06/28/2020  . CBC with Differential/Platelet    Standing Status:   Future    Standing Expiration Date:   06/28/2020    I discussed the assessment and treatment plan with the patient. The patient was provided an opportunity to ask questions and all were answered. The patient agreed with the plan and demonstrated an understanding of the instructions. The patient was advised to call back or seek an in-person evaluation if the symptoms worsen or if the condition fails to improve as anticipated.  Follow up plan: No follow-ups on file.  Eustaquio BoydenJavier Foday Cone, MD

## 2019-07-05 ENCOUNTER — Other Ambulatory Visit (INDEPENDENT_AMBULATORY_CARE_PROVIDER_SITE_OTHER): Payer: BLUE CROSS/BLUE SHIELD

## 2019-07-05 DIAGNOSIS — J029 Acute pharyngitis, unspecified: Secondary | ICD-10-CM | POA: Diagnosis not present

## 2019-07-05 DIAGNOSIS — R05 Cough: Secondary | ICD-10-CM

## 2019-07-05 DIAGNOSIS — Z13220 Encounter for screening for lipoid disorders: Secondary | ICD-10-CM | POA: Diagnosis not present

## 2019-07-05 DIAGNOSIS — R059 Cough, unspecified: Secondary | ICD-10-CM

## 2019-07-05 LAB — CBC WITH DIFFERENTIAL/PLATELET
Basophils Absolute: 0 10*3/uL (ref 0.0–0.1)
Basophils Relative: 1 % (ref 0.0–3.0)
Eosinophils Absolute: 0.1 10*3/uL (ref 0.0–0.7)
Eosinophils Relative: 2.2 % (ref 0.0–5.0)
HCT: 46.4 % (ref 39.0–52.0)
Hemoglobin: 16.1 g/dL (ref 13.0–17.0)
Lymphocytes Relative: 28.2 % (ref 12.0–46.0)
Lymphs Abs: 1.4 10*3/uL (ref 0.7–4.0)
MCHC: 34.7 g/dL (ref 30.0–36.0)
MCV: 85.4 fl (ref 78.0–100.0)
Monocytes Absolute: 0.4 10*3/uL (ref 0.1–1.0)
Monocytes Relative: 8.6 % (ref 3.0–12.0)
Neutro Abs: 3 10*3/uL (ref 1.4–7.7)
Neutrophils Relative %: 60 % (ref 43.0–77.0)
Platelets: 175 10*3/uL (ref 150.0–400.0)
RBC: 5.44 Mil/uL (ref 4.22–5.81)
RDW: 13.1 % (ref 11.5–15.5)
WBC: 5 10*3/uL (ref 4.0–10.5)

## 2019-07-05 LAB — LIPID PANEL
Cholesterol: 87 mg/dL (ref 0–200)
HDL: 34.7 mg/dL — ABNORMAL LOW (ref >39.00)
LDL Cholesterol: 41 mg/dL (ref 0–99)
NonHDL: 51.8
Total CHOL/HDL Ratio: 2
Triglycerides: 53 mg/dL (ref 0.0–149.0)
VLDL: 10.6 mg/dL (ref 0.0–40.0)

## 2019-07-05 LAB — COMPREHENSIVE METABOLIC PANEL
ALT: 33 U/L (ref 0–53)
AST: 24 U/L (ref 0–37)
Albumin: 4.8 g/dL (ref 3.5–5.2)
Alkaline Phosphatase: 90 U/L (ref 39–117)
BUN: 14 mg/dL (ref 6–23)
CO2: 30 mEq/L (ref 19–32)
Calcium: 10.1 mg/dL (ref 8.4–10.5)
Chloride: 103 mEq/L (ref 96–112)
Creatinine, Ser: 0.9 mg/dL (ref 0.40–1.50)
GFR: 102.1 mL/min (ref >60.00)
Glucose, Bld: 83 mg/dL (ref 70–99)
Potassium: 4.9 mEq/L (ref 3.5–5.1)
Sodium: 141 mEq/L (ref 135–145)
Total Bilirubin: 0.6 mg/dL (ref 0.2–1.2)
Total Protein: 6.9 g/dL (ref 6.0–8.3)

## 2019-07-05 LAB — TSH: TSH: 2.36 u[IU]/mL (ref 0.35–4.50)

## 2019-07-05 LAB — T4, FREE: Free T4: 1.35 ng/dL (ref 0.60–1.60)

## 2019-07-07 ENCOUNTER — Ambulatory Visit: Payer: BLUE CROSS/BLUE SHIELD | Admitting: Family Medicine

## 2019-07-07 DIAGNOSIS — Z0289 Encounter for other administrative examinations: Secondary | ICD-10-CM

## 2019-09-10 ENCOUNTER — Encounter: Payer: BLUE CROSS/BLUE SHIELD | Admitting: Family Medicine

## 2020-01-30 ENCOUNTER — Emergency Department: Payer: 59

## 2020-01-30 ENCOUNTER — Emergency Department
Admission: EM | Admit: 2020-01-30 | Discharge: 2020-01-30 | Disposition: A | Discharge status: Home / Self Care | Payer: 59 | Attending: Emergency Medicine | Admitting: Emergency Medicine

## 2020-01-30 ENCOUNTER — Other Ambulatory Visit: Payer: Self-pay

## 2020-01-30 DIAGNOSIS — R079 Chest pain, unspecified: Secondary | ICD-10-CM | POA: Insufficient documentation

## 2020-01-30 DIAGNOSIS — R0602 Shortness of breath: Secondary | ICD-10-CM | POA: Insufficient documentation

## 2020-01-30 DIAGNOSIS — Z87891 Personal history of nicotine dependence: Secondary | ICD-10-CM | POA: Insufficient documentation

## 2020-01-30 LAB — BASIC METABOLIC PANEL
Anion gap: 12 (ref 5–15)
BUN: 12 mg/dL (ref 6–20)
CO2: 25 mmol/L (ref 22–32)
Calcium: 9.7 mg/dL (ref 8.9–10.3)
Chloride: 102 mmol/L (ref 98–111)
Creatinine, Ser: 0.78 mg/dL (ref 0.61–1.24)
GFR calc Af Amer: >60 mL/min (ref >60)
GFR calc non Af Amer: >60 mL/min (ref >60)
Glucose, Bld: 94 mg/dL (ref 70–99)
Potassium: 3.9 mmol/L (ref 3.5–5.1)
Sodium: 139 mmol/L (ref 135–145)

## 2020-01-30 LAB — CBC
HCT: 44.2 % (ref 39.0–52.0)
Hemoglobin: 15.4 g/dL (ref 13.0–17.0)
MCH: 29.3 pg (ref 26.0–34.0)
MCHC: 34.8 g/dL (ref 30.0–36.0)
MCV: 84 fL (ref 80.0–100.0)
Platelets: 202 10*3/uL (ref 150–400)
RBC: 5.26 MIL/uL (ref 4.22–5.81)
RDW: 12.4 % (ref 11.5–15.5)
WBC: 6.6 10*3/uL (ref 4.0–10.5)
nRBC: 0 % (ref 0.0–0.2)

## 2020-01-30 LAB — HEPATIC FUNCTION PANEL
ALT: 41 U/L (ref 0–44)
AST: 29 U/L (ref 15–41)
Albumin: 4.7 g/dL (ref 3.5–5.0)
Alkaline Phosphatase: 80 U/L (ref 38–126)
Bilirubin, Direct: 0.2 mg/dL (ref 0.0–0.2)
Indirect Bilirubin: 0.8 mg/dL (ref 0.3–0.9)
Total Bilirubin: 1 mg/dL (ref 0.3–1.2)
Total Protein: 7.1 g/dL (ref 6.5–8.1)

## 2020-01-30 LAB — TROPONIN I (HIGH SENSITIVITY)
Troponin I (High Sensitivity): 6 ng/L (ref <18)
Troponin I (High Sensitivity): 5 ng/L (ref <18)

## 2020-01-30 LAB — LIPASE, BLOOD: Lipase: 27 U/L (ref 11–51)

## 2020-01-30 LAB — FIBRIN DERIVATIVES D-DIMER (ARMC ONLY): Fibrin derivatives D-dimer (ARMC): 223.32 ng/mL (FEU) (ref 0.00–499.00)

## 2020-01-30 LAB — CK: Total CK: 82 U/L (ref 49–397)

## 2020-01-30 MED ORDER — ALBUTEROL SULFATE (2.5 MG/3ML) 0.083% IN NEBU
2.5000 mg | INHALATION_SOLUTION | Freq: Once | RESPIRATORY_TRACT | Status: AC
  Administered 2020-01-30: 2.5 mg via RESPIRATORY_TRACT
  Filled 2020-01-30: qty 3

## 2020-01-30 MED ORDER — ALBUTEROL SULFATE HFA 108 (90 BASE) MCG/ACT IN AERS: 2.0000 | INHALATION_SPRAY | RESPIRATORY_TRACT | 0 refills | Status: DC | PRN

## 2020-01-30 MED ORDER — SODIUM CHLORIDE 0.9% FLUSH: 3.0000 mL | Freq: Once | INTRAVENOUS | Status: DC

## 2020-01-30 NOTE — Discharge Instructions (Addendum)
1.  You may use Albuterol inhaler 2 puffs every 4 hours as needed for difficulty breathing. 2.  Return to the ER for worsening symptoms, persistent vomiting, difficulty breathing or other concerns. 

## 2020-01-30 NOTE — ED Provider Notes (Signed)
[facility] Emergency Department Provider Note   ____________________________________________   First MD Initiated Contact with Patient 01/30/20 [phone]     (approximate)  I have reviewed the triage vital signs and the nursing notes.   HISTORY  Chief Complaint Chest Pain    HPI OCTAVIOUS Russo is a 29 y.o. male who presents to the ED from home with a chief complaint of chest pain.  Patient reports left-sided chest tightness radiating to his back for the past 4 days.  Occasionally associated with shortness of breath.  Sometimes feels like he cannot get a deep breath in.  Seems to be occurring more often after he eats.  States he quit caffeine due to side effects and resumed working out about that time.  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  No further cardiac surgeries as a child or adult.  Denies fever, cough, abdominal pain, diaphoresis, nausea, vomiting, abdominal pain, dizziness.  Denies recent travel, trauma no hormone use.  Denies as COVID-19 exposure.       Past Medical History:  Diagnosis Date  . Alcohol use   . History of smoking   . 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 Transposition of great arteries, corrected    infant, Dr. Elizebeth Brooking, no need for SBE ppx, 05/2011 last check WNL, rtc 2 yrs    Patient Active Problem List   Diagnosis Date Noted  . Cough 06/29/2019  . Right anterior knee pain 11/13/2018  . Chest pain at rest 12/14/2015  . Alcohol abuse 12/14/2015  . Anxiety state 12/07/2014  . Skin rash 06/19/2012  . Congenitally corrected transposition of great arteries   . Healthcare maintenance 05/21/2011    Past Surgical History:  Procedure Laterality Date  . holter  06/2011   rare PAC, PVC  . scrotal US  01/2012   1.3cm epididymal cyst on left, o/w normal  . TRANSPOSITION OF GREAT VESSELS REPAIR     infant  .  US ECHOCARDIOGRAPHY  05/2011   normal bi vent size/fxn, normal flow across all valves, subsystemic R vent pressure, patent aortic arch, no pericardial effusion    Prior to Admission medications   Medication Sig Start Date End Date Taking? Authorizing Provider  albuterol (VENTOLIN HFA) 108 (90 Base) MCG/ACT inhaler Inhale 2 puffs into the lungs every 4 (four) hours as needed for wheezing or shortness of breath. 01/30/20   Irean Hong, MD  amoxicillin (AMOXIL) 500 MG capsule Take 4 tablets (2,000mg ) 2 hours prior to any dental procedure. 06/14/19   Antonieta Iba, MD  azithromycin (ZITHROMAX) 250 MG tablet Take two tablets on day one followed by one tablet on days 2-5 06/29/19   Eustaquio Boyden, MD  benzonatate (TESSALON) 100 MG capsule Take 1 capsule (100 mg total) by mouth 3 (three) times daily as needed for cough. 06/29/19   Eustaquio Boyden, MD    Allergies Patient has no known allergies.  Family History  Problem Relation Age of Onset  . Diabetes Maternal Grandmother   . Coronary artery disease Maternal Uncle 60  . Cancer Maternal Uncle        pancreatic cancer  . Stroke Neg Hx     Social History Social History   Tobacco Use  . Smoking status: Former Smoker    Packs/day: 0.50    Years: 2.00    Pack years: 1.00  Types: Cigarettes    Quit date: 10/22/2014    Years since quitting: 5.2  . Smokeless tobacco: Former Systems developer    Types: Snuff  Substance Use Topics  . Alcohol use: Yes    Alcohol/week: 0.0 standard drinks    Comment: Occasional  . Drug use: No    Types: Marijuana    Comment: MJ-(trying to stop 12/07/14)    Review of Systems  Constitutional: No fever/chills Eyes: No visual changes. ENT: No sore throat. Cardiovascular: Positive for chest pain. Respiratory: Denies shortness of breath. Gastrointestinal: No abdominal pain.  No nausea, no vomiting.  No diarrhea.  No constipation. Genitourinary: Negative for dysuria. Musculoskeletal: Negative for back  pain. Skin: Negative for rash. Neurological: Negative for headaches, focal weakness or numbness.   ____________________________________________   PHYSICAL EXAM:  VITAL SIGNS: ED Triage Vitals  Enc Vitals Group     BP 01/30/20 0105 117/62     Pulse Rate 01/30/20 0105 76     Resp 01/30/20 0105 18     Temp 01/30/20 0105 98.5 F (36.9 C)     Temp Source 01/30/20 0105 Oral     SpO2 01/30/20 0105 99 %     Weight 01/30/20 0106 165 lb (74.8 kg)     Height 01/30/20 0106 5\' 8"  (1.727 m)     Head Circumference --      Peak Flow --      Pain Score 01/30/20 0106 2     Pain Loc --      Pain Edu? --      Excl. in [place]? --     Constitutional: Alert and oriented. Well appearing and in no acute distress. Eyes: Conjunctivae are normal. PERRL. EOMI. Head: Atraumatic. Nose: No congestion/rhinnorhea. Mouth/Throat: Mucous membranes are moist.  Oropharynx non-erythematous. Neck: No stridor.   Cardiovascular: Normal rate, regular rhythm. Grossly normal heart sounds.  Good peripheral circulation. Respiratory: Normal respiratory effort.  No retractions. Lungs slightly diminished bibasilarly. Gastrointestinal: Soft and nontender. No distention. No abdominal bruits. No CVA tenderness. Musculoskeletal: No lower extremity tenderness nor edema.  No joint effusions. Neurologic:  Normal speech and language. No gross focal neurologic deficits are appreciated. No gait instability. Skin:  Skin is warm, dry and intact. No rash noted. Psychiatric: Mood and affect are normal. Speech and behavior are normal.  ____________________________________________   LABS (all labs ordered are listed, but only abnormal results are displayed)  Labs Reviewed  BASIC METABOLIC PANEL  CBC  HEPATIC FUNCTION PANEL  LIPASE, BLOOD  FIBRIN DERIVATIVES D-DIMER ([facility] ONLY)  CK  TROPONIN I (HIGH SENSITIVITY)  TROPONIN I (HIGH SENSITIVITY)   ____________________________________________  EKG  ED ECG REPORT I, Eeva Schlosser J,  the attending physician, personally viewed and interpreted this ECG.   Date: 01/30/2020  EKG Time: 0059  Rate: 69  Rhythm: normal EKG, normal sinus rhythm  Axis: RAD  Intervals:right bundle branch block  ST&T Change: Nonspecific  ____________________________________________  RADIOLOGY  ED MD interpretation: No acute cardiopulmonary process  Official radiology report(s): DG Chest 2 View  Result Date: 01/30/2020 CLINICAL DATA:  Left chest pain radiating to back, short of breath for 4 days EXAM: CHEST - 2 VIEW COMPARISON:  05/08/2017 FINDINGS: Frontal and lateral views of the chest demonstrate an unremarkable cardiac silhouette. No airspace disease, effusion, or pneumothorax. No acute bony abnormalities. IMPRESSION: No active cardiopulmonary disease. Electronically Signed   By: Randa Ngo M.D.   On: 01/30/2020 01:28    ____________________________________________   PROCEDURES  Procedure(s) performed (including Critical Care):  Procedures   ____________________________________________   INITIAL IMPRESSION / ASSESSMENT AND PLAN / ED COURSE  As part of my medical decision making, I reviewed the following data within the electronic MEDICAL RECORD NUMBER Nursing notes reviewed and incorporated, Labs reviewed, EKG interpreted, Old chart reviewed, Radiograph reviewed and Notes from prior ED visits     NORVAL SLAVEN was evaluated in Emergency Department on 01/30/2020 for the symptoms described in the history of present illness. He was evaluated in the context of the global COVID-19 pandemic, which necessitated consideration that the patient might be at risk for infection with the SARS-CoV-2 virus that causes COVID-19. Institutional protocols and algorithms that pertain to the evaluation of patients at risk for COVID-19 are in a state of rapid change based on information released by regulatory bodies including the CDC and federal and state organizations. These policies and algorithms were  followed during the patient's care in the ED.    29 year old male presenting with a 4-day history of chest pain with occasional shortness of breath. Differential diagnosis includes, but is not limited to, ACS, aortic dissection, pulmonary embolism, cardiac tamponade, pneumothorax, pneumonia, pericarditis, myocarditis, GI-related causes including esophagitis/gastritis, and musculoskeletal chest wall pain.    Troponin within normal limits.  Do not feel repeat troponin is warranted given duration of chest pain; troponin would be elevated if patient symptoms were primarily cardiac in nature.  Will check hepatic function, lipase, D-dimer and CK. Patient currently voices no complaints.   Clinical Course as of Jan 29 627  Wynelle Link Jan 30, 2020  1027 Updated patient on all test results.  He is feeling better after nebulizer treatment.  Will discharge home with prescription for albuterol inhaler to use as needed and patient will follow up closely with his cardiologist next week.  Strict return precautions given.  Patient verbalizes understanding and agrees with plan of care.   [JS]    Clinical Course User Index [JS] Irean Hong, MD     ____________________________________________   FINAL CLINICAL IMPRESSION(S) / ED DIAGNOSES  Final diagnoses:  Nonspecific chest pain     ED Discharge Orders         Ordered    albuterol (VENTOLIN HFA) 108 (90 Base) MCG/ACT inhaler  Every 4 hours PRN    Note to Pharmacy: Formulate based on pt/insurance affordability   01/30/20 0627           Note:  This document was prepared using Dragon voice recognition software and may include unintentional dictation errors.   Irean Hong, MD 01/30/20 [phone]

## 2020-01-30 NOTE — ED Triage Notes (Signed)
Patient c/o left chest pain radiating to back and SOB X 4 days. Patient reports hx of juxtaposition of the great vessels with surgery to correct the defect as an infant; denies any cardiac issues in adulthood.

## 2020-01-31 ENCOUNTER — Telehealth: Payer: Self-pay

## 2020-01-31 NOTE — Telephone Encounter (Signed)
Patient contacted the office today in regards to a hospital f/u appt.  Patient states he has a history of heart surgery as an infant, and he had followed up with cardiology for years as a child for this. Patient was seen in the ER yesterday for chest pain and SOB. Patient states these issues have been increasing over the past few months. Patient was tearful on the phone and states he is feeling very anxious. He states he has an appt with cardiology on Wednesday, but he really wants to see Dr. Reece Agar either today or tomorrow.  Dr. Reece Agar, is there anywhere I can work him in to see you?

## 2020-01-31 NOTE — Telephone Encounter (Signed)
Ok to place in [place]. Thanks.

## 2020-01-31 NOTE — Telephone Encounter (Signed)
Pt called back checking on appointment Best number [phone]

## 2020-02-01 ENCOUNTER — Other Ambulatory Visit: Payer: Self-pay

## 2020-02-01 ENCOUNTER — Encounter: Payer: Self-pay | Admitting: Family Medicine

## 2020-02-01 ENCOUNTER — Ambulatory Visit: Payer: 59 | Admitting: Family Medicine

## 2020-02-01 VITALS — BP 118/66 | HR 92 | Temp 97.8°F | Ht 68.0 in | Wt 161.5 lb

## 2020-02-01 DIAGNOSIS — R079 Chest pain, unspecified: Secondary | ICD-10-CM

## 2020-02-01 DIAGNOSIS — Q205 Discordant atrioventricular connection: Secondary | ICD-10-CM

## 2020-02-01 DIAGNOSIS — Z87898 Personal history of other specified conditions: Secondary | ICD-10-CM

## 2020-02-01 DIAGNOSIS — I77810 Thoracic aortic ectasia: Secondary | ICD-10-CM

## 2020-02-01 DIAGNOSIS — F411 Generalized anxiety disorder: Secondary | ICD-10-CM

## 2020-02-01 DIAGNOSIS — R011 Cardiac murmur, unspecified: Secondary | ICD-10-CM

## 2020-02-01 MED ORDER — HYDROXYZINE HCL 25 MG PO TABS: 12.5000 mg | ORAL_TABLET | Freq: Two times a day (BID) | ORAL | 0 refills | Status: DC | PRN

## 2020-02-01 NOTE — Progress Notes (Signed)
This visit was conducted in person.  BP 118/66 (BP Location: Left Arm, Patient Position: Sitting, Cuff Size: Normal)   Pulse 92   Temp 97.8 F (36.6 C) (Temporal)   Ht 5\' 8"  (1.727 m)   Wt 161 lb 8 oz (73.3 kg)   SpO2 97%   BMI 24.56 kg/m    CC: ER f/u visit Subjective:    Patient ID: , male    DOB: [birth year], 29 y.o.   MRN: [id number]  HPI: Danny Russo is a 29 y.o. male presenting on 02/01/2020 for Hospitalization Follow-up and Shoulder Pain (C/o left shoulder pain.  Started 2-3 days ago.  Denies injury.  Trying ASA. )   Recently seen at ER for dyspnea and chest pain, note reviewed. L sided chest discomfort with radiation to the L shoulder blade and L shoulder down arm described as pressure discomfort. Worse after eating. He was treated with nebulizer with benefit. Sent home with albuterol inhaler. He started taking 3 aspirin 81mg  daily. Yesterday discomfort was improving. Stopped caffeine due to this. Has started exercising more regularly - walking 2-3 mi/day, some light cycling. He remains worried this could be bacterial endocarditis.   Denies fevers/chills, dizziness, lightheadedness, headache, cough, palpitations.   Recently had some dental work for known cavities 06/2019 and again ~10/2019. He does take amoxicillin prophylactically.   CXR, EKG reassuring at ER.  Has scheduled cardiology appt for tomorrow 07/2019).    CHEST - 2 VIEW CLINICAL DATA:  Left chest pain radiating to back, short of breath for 4 days COMPARISON:  05/08/2017 FINDINGS: Frontal and lateral views of the chest demonstrate an unremarkable cardiac silhouette. No airspace disease, effusion, or pneumothorax. No acute bony abnormalities. IMPRESSION: No active cardiopulmonary disease. Electronically Signed   By: Mariah Milling M.D.   On: 01/30/2020 01:28  Denies heartburn symptoms. No cough, wheezing.  No h/o asthma.  Quit smoking 2015.  MJ - off this for last 3-4 yrs  No alcohol.    Has stopped caffeine.   H/o corrected transposition of great arteries as infant.  Missed CPE last summer - had insurance switch.  Occupation: currently unemployed - last week worked [employer]      Relevant past medical, surgical, family and social history reviewed and updated as indicated. Interim medical history since our last visit reviewed. Allergies and medications reviewed and updated. Outpatient Medications Prior to Visit  Medication Sig Dispense Refill  . albuterol (VENTOLIN HFA) 108 (90 Base) MCG/ACT inhaler Inhale 2 puffs into the lungs every 4 (four) hours as needed for wheezing or shortness of breath. 18 g 0  . amoxicillin (AMOXIL) 500 MG capsule Take 4 tablets (2,000mg ) 2 hours prior to any dental procedure. 4 capsule 2  . ASPIRIN 81 PO Take by mouth daily.    02/01/2020 azithromycin (ZITHROMAX) 250 MG tablet Take two tablets on day one followed by one tablet on days 2-5 6 each 0  . benzonatate (TESSALON) 100 MG capsule Take 1 capsule (100 mg total) by mouth 3 (three) times daily as needed for cough. 30 capsule 0   No facility-administered medications prior to visit.     Per HPI unless specifically indicated in ROS section below Review of Systems Objective:    BP 118/66 (BP Location: Left Arm, Patient Position: Sitting, Cuff Size: Normal)   Pulse 92   Temp 97.8 F (36.6 C) (Temporal)   Ht 5\' 8"  (1.727 m)   Wt 161 lb 8 oz (73.3 kg)  SpO2 97%   BMI 24.56 kg/m   Wt Readings from Last 3 Encounters:  02/02/20 162 lb (73.5 kg)  02/01/20 161 lb 8 oz (73.3 kg)  01/30/20 165 lb (74.8 kg)    Physical Exam Vitals and nursing note reviewed.  Constitutional:      Appearance: Normal appearance. He is not ill-appearing.  HENT:     Head: Normocephalic and atraumatic.  Eyes:     Extraocular Movements: Extraocular movements intact.     Pupils: Pupils are equal, round, and reactive to light.  Neck:     Thyroid: No thyromegaly or thyroid tenderness.  Cardiovascular:      Rate and Rhythm: Normal rate and regular rhythm.     Pulses: Normal pulses.     Heart sounds: Murmur (3/6 holosystolic throughout) present.  Pulmonary:     Effort: Pulmonary effort is normal. No respiratory distress.     Breath sounds: Normal breath sounds. No wheezing, rhonchi or rales.  Chest:     Chest wall: No tenderness.     Comments: No reproducible chest wall pain Musculoskeletal:     Right lower leg: No edema.     Left lower leg: No edema.  Skin:    Findings: No rash.  Neurological:     Mental Status: He is alert.  Psychiatric:        Mood and Affect: Mood normal.        Behavior: Behavior normal.       Results for orders placed or performed during the hospital encounter of 01/30/20  Basic metabolic panel  Result Value Ref Range   Sodium 139 135 - 145 mmol/L   Potassium 3.9 3.5 - 5.1 mmol/L   Chloride 102 98 - 111 mmol/L   CO2 25 22 - 32 mmol/L   Glucose, Bld 94 70 - 99 mg/dL   BUN 12 6 - 20 mg/dL   Creatinine, Ser 1.77 0.61 - 1.24 mg/dL   Calcium 9.7 8.9 - 93.9 mg/dL   GFR calc non Af Amer >60 >60 mL/min   GFR calc Af Amer >60 >60 mL/min   Anion gap 12 5 - 15  CBC  Result Value Ref Range   WBC 6.6 4.0 - 10.5 K/uL   RBC 5.26 4.22 - 5.81 MIL/uL   Hemoglobin 15.4 13.0 - 17.0 g/dL   HCT 03.0 09.2 - 33.0 %   MCV 84.0 80.0 - 100.0 fL   MCH 29.3 26.0 - 34.0 pg   MCHC 34.8 30.0 - 36.0 g/dL   RDW 07.6 22.6 - 33.3 %   Platelets 202 150 - 400 K/uL   nRBC 0.0 0.0 - 0.2 %  Hepatic function panel  Result Value Ref Range   Total Protein 7.1 6.5 - 8.1 g/dL   Albumin 4.7 3.5 - 5.0 g/dL   AST 29 15 - 41 U/L   ALT 41 0 - 44 U/L   Alkaline Phosphatase 80 38 - 126 U/L   Total Bilirubin 1.0 0.3 - 1.2 mg/dL   Bilirubin, Direct 0.2 0.0 - 0.2 mg/dL   Indirect Bilirubin 0.8 0.3 - 0.9 mg/dL  Lipase, blood  Result Value Ref Range   Lipase 27 11 - 51 U/L  Fibrin derivatives D-Dimer  Result Value Ref Range   Fibrin derivatives D-dimer ([facility]) 223.32 0.00 - 499.00 ng/mL (FEU)   CK  Result Value Ref Range   Total CK 82 49 - 397 U/L  Troponin I (High Sensitivity)  Result Value Ref Range   Troponin  I (High Sensitivity) 6 <18 ng/L  Troponin I (High Sensitivity)  Result Value Ref Range   Troponin I (High Sensitivity) 5 <18 ng/L   Assessment & Plan:  This visit occurred during the SARS-CoV-2 public health emergency.  Safety protocols were in place, including screening questions prior to the visit, additional usage of staff PPE, and extensive cleaning of exam room while observing appropriate contact time as indicated for disinfecting solutions.   Problem List Items Addressed This Visit    Systolic murmur    In h/o transposition of great vessels. Keep f/u appt tomorrow with cards.       History of alcohol use    Abstinent from alcohol use. Quit smoking 2015. No MJ use in the last 3-4 years.       Congenitally corrected transposition of great arteries   Relevant Medications   ASPIRIN 81 PO   Chest pain - Primary    Reviewed reassuring ER evaluation with patient - not consistent with ischemia. Troponin and D dimer were normal. Lungs sound clear today.  Louder murmur heard today - may benefit from updated echocardiogram.  He has cards f/u planned tomorrow.       Aortic root dilatation (HCC)    ADDENDUM => 4.1cm on recent echo      Relevant Medications   ASPIRIN 81 PO   Anxiety state    Trial hydroxyzine PRN anxiety - has tolerated well in the past. Pt desires to avoid habit forming medication.       Relevant Medications   hydrOXYzine (ATARAX/VISTARIL) 25 MG tablet       Meds ordered this encounter  Medications  . hydrOXYzine (ATARAX/VISTARIL) 25 MG tablet    Sig: Take 0.5-1 tablets (12.5-25 mg total) by mouth 2 (two) times daily as needed for anxiety (sedation precautions).    Dispense:  30 tablet    Refill:  0   No orders of the defined types were placed in this encounter.   Patient Instructions  Good to see you today.  May try hydroxyzine  25mg  1/2-1 tablet as needed for stress/anxiety.  Blood work, chest xray at ER were very reassuring.  Keep follow up with Dr Rockey Situ.    Follow up plan: Return if symptoms worsen or fail to improve.  Ria Bush, MD

## 2020-02-01 NOTE — Patient Instructions (Signed)
Good to see you today.  May try hydroxyzine 25mg  1/2-1 tablet as needed for stress/anxiety.  Blood work, chest xray at ER were very reassuring.  Keep follow up with Dr .

## 2020-02-01 NOTE — Progress Notes (Signed)
Cardiology Office Note    Date:  02/02/2020   ID:  Danny Russo, DOB August 25, 1991, MRN [id number]  PCP:  Eustaquio Boyden, MD  Cardiologist:  Julien Nordmann, MD  Electrophysiologist:  None   Chief Complaint: ED follow-up  History of Present Illness:   Danny Russo is a 29 y.o. 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  Remote cardiac monitoring showed rare PACs and PVCs.  Echo from 2017 showed an EF of 60 to 65%, normal wall motion, normal LV diastolic function, mildly dilated aortic root measuring 3.9 cm, mild mitral regurgitation.  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  Most recent echo from 07/2017 showed an EF of 55 to 60%, no regional wall motion abnormalities, normal LV diastolic function parameters, mildly dilated aortic root measuring 3.8 cm, mildly dilated ascending aorta, and mildly dilated RV with mildly reduced RV systolic function.  He was most recently seen in the office in 07/2018 and reported he was doing well without any significant chest pain.  He was seen in the ED on 01/30/2020 with a 4-day history of left-sided chest tightness that radiated to his back occasionally associated with shortness of breath.  Symptoms seemed to correlate with food consumption.  Initial high-sensitivity troponin of 6 with a delta of 5.  D-dimer normal.  CK normal.  Lipase normal.  LFTs normal.  CBC normal.  BMP normal.  CXR without acute cardiopulmonary disease.  EKG nonacute.  Symptoms improved with nebulizer therapy and Danny Russo was discharged with albuterol.  He comes in today for follow-up of the above.  He indicates he wonders if he may be sensitive to caffeine.  He had recently been drinking green tea prior to the above onset of symptoms.   Last dose of caffeine approximately 1.5 weeks prior.  He had also recently started an exercise regimen.  He did note improvement in symptoms with albuterol nebulizer in the ED.  He has only used his prescribed albuterol inhaler once, the day after his ED visit, and is uncertain if this helped significantly thereafter.  He has continued to note randomly occurring left-sided chest pressure described as a "burning" that radiates to the left shoulder.  Symptoms will typically last 10 to 15 minutes followed by resolution.  This is associated with some shortness of breath.  He was recently seen by his PCP on 3/2 and prescribed hydroxyzine for possible anxiety component.   Labs independently reviewed: 01/2020 - Hgb 15.4, PLT 202, albumin 4.7, AST/ALT normal, potassium 3.9, BUN 12, serum creatinine 0.78 07/2019 - TC 87, TG 53, HDL 34, LDL 41, TSH normal  Past Medical History:  Diagnosis Date  . Alcohol use   . History of smoking   . 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 Transposition of great arteries, corrected    infant, Dr. Elizebeth Brooking, no need for SBE ppx, 05/2011 last check WNL, rtc 2 yrs    Past Surgical History:  Procedure Laterality Date  . holter  06/2011   rare PAC, PVC  . scrotal US  01/2012   1.3cm epididymal cyst on left, o/w normal  . TRANSPOSITION OF GREAT VESSELS REPAIR  infant  . US ECHOCARDIOGRAPHY  05/2011   normal bi vent size/fxn, normal flow across all valves, subsystemic R vent pressure, patent aortic arch, no pericardial effusion    Current Medications: Current Meds  Medication Sig  . albuterol (VENTOLIN HFA) 108 (90 Base) MCG/ACT inhaler Inhale 2 puffs into the lungs every 4 (four) hours as needed for wheezing or shortness of breath.  Marland Kitchen amoxicillin (AMOXIL) 500 MG capsule Take 4 tablets (2,000mg ) 2 hours prior to any dental procedure.  . ASPIRIN 81 PO Take by mouth daily.  . hydrOXYzine  (ATARAX/VISTARIL) 25 MG tablet Take 0.5-1 tablets (12.5-25 mg total) by mouth 2 (two) times daily as needed for anxiety (sedation precautions).    Allergies:   Danny Russo has no known allergies.   Social History   Socioeconomic History  . Marital status: Single    Spouse name: Not on file  . Number of children: 0  . Years of education: HS  . Highest education level: Not on file  Occupational History  . Not on file  Tobacco Use  . Smoking status: Former Smoker    Packs/day: 0.50    Years: 2.00    Pack years: 1.00    Types: Cigarettes    Quit date: 10/22/2014    Years since quitting: 5.2  . Smokeless tobacco: Former Neurosurgeon    Types: Snuff  Substance and Sexual Activity  . Alcohol use: Yes    Alcohol/week: 0.0 standard drinks    Comment: Occasional  . Drug use: No    Types: Marijuana    Comment: MJ-(trying to stop 12/07/14)  . Sexual activity: Not on file  Other Topics Concern  . Not on file  Social History Narrative   Lives with grandparents and great grandmother, 1 dog.   Mother lives in [place], Father lives in [place], stays in touch with them.   Neighbor with Lorinda Creed   Edu: HS graduate   Activity: trying to stay active.   Social Determinants of Health   Financial Resource Strain:   . Difficulty of Paying Living Expenses: Not on file  Food Insecurity:   . Worried About Programme researcher, broadcasting/film/video in the Last Year: Not on file  . Ran Out of Food in the Last Year: Not on file  Transportation Needs:   . Lack of Transportation (Medical): Not on file  . Lack of Transportation (Non-Medical): Not on file  Physical Activity:   . Days of Exercise per Week: Not on file  . Minutes of Exercise per Session: Not on file  Stress:   . Feeling of Stress : Not on file  Social Connections:   . Frequency of Communication with Friends and Family: Not on file  . Frequency of Social Gatherings with Friends and Family: Not on file  . Attends Religious Services: Not on file  . Active Member of Clubs or  Organizations: Not on file  . Attends Banker Meetings: Not on file  . Marital Status: Not on file     Family History:  The Danny Russo's family history includes Cancer in his maternal uncle; Coronary artery disease (age of onset: 57) in his maternal uncle; Diabetes in his maternal grandmother. There is no history of Stroke.  ROS:   Review of Systems  Constitutional: Positive for malaise/fatigue. Negative for chills, diaphoresis, fever and weight loss.  HENT: Negative for congestion.   Eyes: Negative for discharge and redness.  Respiratory: Negative for cough, sputum production, shortness of breath and wheezing.  Cardiovascular: Positive for chest pain. Negative for palpitations, orthopnea, claudication, leg swelling and PND.  Gastrointestinal: Negative for abdominal pain, heartburn, nausea and vomiting.  Musculoskeletal: Negative for falls and myalgias.  Skin: Negative for rash.  Neurological: Positive for weakness. Negative for dizziness, tingling, tremors, sensory change, speech change, focal weakness and loss of consciousness.  Psychiatric/Behavioral: Negative for substance abuse. The Danny Russo is not nervous/anxious.   All other systems reviewed and are negative.    EKGs/Labs/Other Studies Reviewed:    Studies reviewed were summarized above. The additional studies were reviewed today: As above.  EKG:  EKG is ordered today.  The EKG ordered today demonstrates NSR, 69 bpm, right axis deviation, no acute ST-T changes, largely unchanged when compared to prior.  Recent Labs: 07/05/2019: TSH 2.36 01/30/2020: ALT 41; BUN 12; Creatinine, Ser 0.78; Hemoglobin 15.4; Platelets 202; Potassium 3.9; Sodium 139  Recent Lipid Panel    Component Value Date/Time   CHOL 87 07/05/2019 0930   TRIG 53.0 07/05/2019 0930   HDL 34.70 (L) 07/05/2019 0930   CHOLHDL 2 07/05/2019 0930   VLDL 10.6 07/05/2019 0930   LDLCALC 41 07/05/2019 0930    PHYSICAL EXAM:    VS:  BP 124/64 (BP Location:  Left Arm, Danny Russo Position: Sitting, Cuff Size: Normal)   Pulse 69   Ht 5\' 7"  (1.702 m)   Wt 162 lb (73.5 kg)   SpO2 98%   BMI 25.37 kg/m   BMI: Body mass index is 25.37 kg/m.  Physical Exam  Constitutional: He is oriented to person, place, and time. He appears well-developed and well-nourished.  HENT:  Head: Normocephalic and atraumatic.  Eyes: Right eye exhibits no discharge. Left eye exhibits no discharge.  Neck: No JVD present.  Cardiovascular: Normal rate, regular rhythm, S1 normal and S2 normal. Exam reveals no distant heart sounds, no friction rub, no midsystolic click and no opening snap.  Murmur heard. Pulses:      Posterior tibial pulses are 2+ on the right side and 2+ on the left side.  II/VI holosystolic murmur heard throughout   Pulmonary/Chest: Effort normal and breath sounds normal. No respiratory distress. He has no decreased breath sounds. He has no wheezes. He has no rales. He exhibits no tenderness.  Abdominal: Soft. He exhibits no distension. There is no abdominal tenderness.  Musculoskeletal:        General: No edema.     Cervical back: Normal range of motion.  Neurological: He is alert and oriented to person, place, and time.  Skin: Skin is warm and dry. No cyanosis. Nails show no clubbing.  Psychiatric: He has a normal mood and affect. His speech is normal and behavior is normal. Judgment and thought content normal.    Wt Readings from Last 3 Encounters:  02/02/20 162 lb (73.5 kg)  02/01/20 161 lb 8 oz (73.3 kg)  01/30/20 165 lb (74.8 kg)     ASSESSMENT & PLAN:   1. Chest pain: Notes indicate prior chest pain at rest without exertional symptoms.  More recently, it appear his symptoms have been exertional.  He is currently chest pain free.  Schedule echo.  Further recommendations pending, including ischemic evaluation, based on the echo results.  2. Murmur/transposition of the great vessels: He is status post surgical repair as an infant.  Check echo as  outlined above.  Most recent LDL of 41 from 07/2019.   3. Aortic root dilatation: Update echo as above.  BP well controlled.   4. Potential anxiety: Recently prescribed  hydroxyzine.  He has not yet started this.  Disposition: F/u with Dr. Mariah Milling or an APP in 3 to 4 weeks.   Medication Adjustments/Labs and Tests Ordered: Current medicines are reviewed at length with the Danny Russo today.  Concerns regarding medicines are outlined above. Medication changes, Labs and Tests ordered today are summarized above and listed in the Danny Russo Instructions accessible in Encounters.   Signed, Eula Listen, PA-C 02/02/2020 12:52 PM     [facility] [address] [place], [place] [address] [phone]

## 2020-02-02 ENCOUNTER — Encounter: Payer: Self-pay | Admitting: Physician Assistant

## 2020-02-02 ENCOUNTER — Ambulatory Visit (INDEPENDENT_AMBULATORY_CARE_PROVIDER_SITE_OTHER): Payer: 59 | Admitting: Physician Assistant

## 2020-02-02 VITALS — BP 124/64 | HR 69 | Ht 67.0 in | Wt 162.0 lb

## 2020-02-02 DIAGNOSIS — I77810 Thoracic aortic ectasia: Secondary | ICD-10-CM | POA: Diagnosis not present

## 2020-02-02 DIAGNOSIS — Q205 Discordant atrioventricular connection: Secondary | ICD-10-CM | POA: Diagnosis not present

## 2020-02-02 DIAGNOSIS — R011 Cardiac murmur, unspecified: Secondary | ICD-10-CM

## 2020-02-02 DIAGNOSIS — R072 Precordial pain: Secondary | ICD-10-CM | POA: Diagnosis not present

## 2020-02-02 DIAGNOSIS — F419 Anxiety disorder, unspecified: Secondary | ICD-10-CM

## 2020-02-02 NOTE — Patient Instructions (Signed)
Medication Instructions:  Your physician recommends that you continue on your current medications as directed. Please refer to the Current Medication list given to you today.  *If you need a refill on your cardiac medications before your next appointment, please call your pharmacy*   Lab Work: None ordered  If you have labs (blood work) drawn today and your tests are completely normal, you will receive your results only by: Marland Kitchen MyChart Message (if you have MyChart) OR . A paper copy in the mail If you have any lab test that is abnormal or we need to change your treatment, we will call you to review the results.   Testing/Procedures: 1- Echo  Please return to [facility] on ______________ at _______________ AM/PM for an Echocardiogram. Your physician has requested that you have an echocardiogram. Echocardiography is a painless test that uses sound waves to create images of your heart. It provides your doctor with information about the size and shape of your heart and how well your heart's chambers and valves are working. This procedure takes approximately one hour. There are no restrictions for this procedure. Please note; depending on visual quality an IV may need to be placed.     Follow-Up: At [facility], you and your health needs are our priority.  As part of our continuing mission to provide you with exceptional heart care, we have created designated Provider Care Teams.  These Care Teams include your primary Cardiologist (physician) and Advanced Practice Providers (APPs -  Physician Assistants and Nurse Practitioners) who all work together to provide you with the care you need, when you need it.  We recommend signing up for the patient portal called "MyChart".  Sign up information is provided on this After Visit Summary.  MyChart is used to connect with patients for Virtual Visits (Telemedicine).  Patients are able to view lab/test results, encounter notes, upcoming  appointments, etc.  Non-urgent messages can be sent to your provider as well.   To learn more about what you can do with MyChart, go to [email or web address].    Your next appointment:   4 week(s)  The format for your next appointment:   In Person  Provider:    You may see Julien Nordmann, MD or Eula Listen, PA-C.

## 2020-02-03 ENCOUNTER — Ambulatory Visit (HOSPITAL_COMMUNITY): Payer: 59 | Attending: Cardiology

## 2020-02-03 ENCOUNTER — Other Ambulatory Visit: Payer: Self-pay

## 2020-02-03 ENCOUNTER — Telehealth: Payer: Self-pay | Admitting: Cardiovascular Disease

## 2020-02-03 DIAGNOSIS — Q205 Discordant atrioventricular connection: Secondary | ICD-10-CM | POA: Diagnosis not present

## 2020-02-03 NOTE — Telephone Encounter (Signed)
Patient calling to get asap echo because he is frequently symptomatic .    Attempted to schedule sooner with [facility] but not available.    Open slot at [address] office   Scheduled and sending to precert

## 2020-02-04 DIAGNOSIS — R011 Cardiac murmur, unspecified: Secondary | ICD-10-CM | POA: Insufficient documentation

## 2020-02-04 DIAGNOSIS — I77810 Thoracic aortic ectasia: Secondary | ICD-10-CM | POA: Insufficient documentation

## 2020-02-04 NOTE — Assessment & Plan Note (Signed)
Abstinent from alcohol use. Quit smoking 2015. No MJ use in the last 3-4 years.

## 2020-02-04 NOTE — Assessment & Plan Note (Signed)
In h/o transposition of great vessels. Keep f/u appt tomorrow with cards.

## 2020-02-04 NOTE — Assessment & Plan Note (Signed)
Trial hydroxyzine PRN anxiety - has tolerated well in the past. Pt desires to avoid habit forming medication.

## 2020-02-04 NOTE — Assessment & Plan Note (Signed)
ADDENDUM => 4.1cm on recent echo

## 2020-02-04 NOTE — Assessment & Plan Note (Addendum)
Reviewed reassuring ER evaluation with patient - not consistent with ischemia. Troponin and D dimer were normal. Lungs sound clear today.  Louder murmur heard today - may benefit from updated echocardiogram.  He has cards f/u planned tomorrow.

## 2020-02-07 ENCOUNTER — Telehealth: Payer: Self-pay

## 2020-02-07 DIAGNOSIS — Q205 Discordant atrioventricular connection: Secondary | ICD-10-CM

## 2020-02-07 MED ORDER — METOPROLOL TARTRATE 100 MG PO TABS: ORAL_TABLET | ORAL | 0 refills | Status: DC

## 2020-02-07 NOTE — Telephone Encounter (Signed)
Call to patient to discuss echo results and POC.  Pt verbalized understanding and agreeable to POC.  Orders placed. Cardiac CT directors reviewed verbally and sent to patient in mychart. Order for adult congenital heart specialist faxed. Written instructions for all orders sent to patient in mychart.  Sent to precert. Pt is wondering how much his cost will be prior to scheduling CT.   Your cardiac CT will be scheduled at one of the below locations:   [facility] [address] [place], [place] [address] [phone]  OR  [facility] [address] [place], [place] [address] [phone]  If scheduled at [facility], please arrive at the [facility] main entrance of [facility] 30 minutes prior to test start time. Proceed to the [facility] Radiology Department (first floor) to check-in and test prep.  If scheduled at [facility], please arrive 15 mins early for check-in and test prep.  Please follow these instructions carefully (unless otherwise directed):  On the Night Before the Test: . Be sure to Drink plenty of water. . Do not consume any caffeinated/decaffeinated beverages or chocolate 12 hours prior to your test. . Do not take any antihistamines 12 hours prior to your test.   On the Day of the Test: . Drink plenty of water. Do not drink any water within one hour of the test. . Do not eat any food 4 hours prior to the test. . You may take your regular medications prior to the test.  . Take metoprolol (Lopressor) two hours prior to test.        After the Test: . Drink plenty of water. . After receiving IV contrast, you may experience a mild flushed feeling. This is normal. . On occasion, you may experience a mild rash up to 24 hours after the test. This is not dangerous. If this occurs, you can take Benadryl 25 mg and increase your fluid intake. . If you experience  trouble breathing, this can be serious. If it is severe call 911 IMMEDIATELY. If it is mild, please call our office. . If you take any of these medications: Glipizide/Metformin, Avandament, Glucavance, please do not take 48 hours after completing test unless otherwise instructed.   Once we have confirmed authorization from your insurance company, we will call you to set up a date and time for your test.   For non-scheduling related questions, please contact the cardiac imaging nurse navigator should you have any questions/concerns: Rockwell Alexandria, RN Navigator Cardiac Imaging [facility] Heart and Vascular Services [phone] office

## 2020-02-07 NOTE — Telephone Encounter (Signed)
-----   Message from Sondra Barges, PA-C sent at 02/04/2020 10:40 AM EST ----- Echo showed normal pump function with a trivially leaky tricuspid valve, as well as a dilated aortic root measuring 41 mm. His aortic valve was noted to be normal in structure (trileaflet on echo from 12/2015). When compared to his echo in 2018, the pump function remains normal. The dilated aortic root has progressed from 38 to 41 mm.   Recommendations: -Schedule coronary CTA to better define his anatomy post surgical repair for transposition of the great vessels as well as to better evaluate his dilated aortic root -If has noted any palpitations, would recommend Zio after his coronary CTA -Please refer him to an adult congenital heart specialist at [facility] as he should be established with them indefinitely given his history. I believe they also come to [facility] at time as well, though am not certain on the specifics of this -Keep follow up with Dr. Mariah Milling next month to discuss the about imaging

## 2020-02-08 ENCOUNTER — Other Ambulatory Visit: Payer: Self-pay | Admitting: Family Medicine

## 2020-02-29 ENCOUNTER — Ambulatory Visit (INDEPENDENT_AMBULATORY_CARE_PROVIDER_SITE_OTHER)
Admission: RE | Admit: 2020-02-29 | Discharge: 2020-02-29 | Disposition: A | Discharge status: Home / Self Care | Payer: 59 | Source: Ambulatory Visit | Attending: Family Medicine | Admitting: Family Medicine

## 2020-02-29 ENCOUNTER — Encounter: Payer: Self-pay | Admitting: Family Medicine

## 2020-02-29 ENCOUNTER — Telehealth: Payer: Self-pay

## 2020-02-29 ENCOUNTER — Other Ambulatory Visit: Payer: Self-pay

## 2020-02-29 ENCOUNTER — Ambulatory Visit (INDEPENDENT_AMBULATORY_CARE_PROVIDER_SITE_OTHER): Payer: 59 | Admitting: Family Medicine

## 2020-02-29 VITALS — BP 110/72 | HR 84 | Temp 98.2°F | Ht 67.0 in | Wt 157.4 lb

## 2020-02-29 DIAGNOSIS — F411 Generalized anxiety disorder: Secondary | ICD-10-CM | POA: Diagnosis not present

## 2020-02-29 DIAGNOSIS — R0602 Shortness of breath: Secondary | ICD-10-CM

## 2020-02-29 DIAGNOSIS — Z77120 Contact with and (suspected) exposure to mold (toxic): Secondary | ICD-10-CM

## 2020-02-29 DIAGNOSIS — R0789 Other chest pain: Secondary | ICD-10-CM

## 2020-02-29 DIAGNOSIS — R011 Cardiac murmur, unspecified: Secondary | ICD-10-CM

## 2020-02-29 DIAGNOSIS — Q205 Discordant atrioventricular connection: Secondary | ICD-10-CM

## 2020-02-29 NOTE — Patient Instructions (Signed)
Labs today Xray today Ok to take albuterol as needed Ok to try hydroxyzine.

## 2020-02-29 NOTE — Telephone Encounter (Signed)
Pt continues for 1 month with SOB like pt cannot get a full breath and CP which is lt sided dull pain that is almost continuous; now at pain level 8. Pt thinks he is in distress but does not sound like he is SOB or in distress on phone does not want to go to UC or ED due to cost and pt said he went to UC yesterday but was advised at front desk if has scan set up for next wk to wait and get scan. Pt did not see provider at [facility]. Pt tested negative for covid on 02/28/20. Pt continues with a dry cough but feels like there is phelgm that will not come up. Pt thinks he may have aspergillosis due to living in a home that has mold. Pt moved to new home 2 days ago. Pt has lt shoulder pain,slight H/A,generalized weakness and pt thinks weakness is worse in lt arm but pt can use arm and grasp things. Pt has dull upper abd pain on and off but does not have pain now. Pt has head congestion and fatigue. Pt has not vomited but felt nauseated last night. Pt is concerned he has a fungal infection or bacteria in his lungs. Dr Reece Agar said OK to schedule pt today at 3 PM. Pt scheduled appt and voiced understanding if condition changes or worsens to go to [facility] or ED. Pt said he does not feel he is in distress and  that he can wait for appt this afternoon. FYI to DR G.

## 2020-02-29 NOTE — Progress Notes (Signed)
This visit was conducted in person.  BP 110/72 (BP Location: Left Arm, Patient Position: Sitting, Cuff Size: Normal)   Pulse 84   Temp 98.2 F (36.8 C) (Temporal)   Ht 5\' 7"  (1.702 m)   Wt 157 lb 6 oz (71.4 kg)   SpO2 97%   BMI 24.65 kg/m    CC: chest pain Subjective:    [name], male    DOB: February 10, 1991, 29 y.o.   MRN: [id number]  HPI: Danny Russo is a 29 y.o. male presenting on 02/29/2020 for Chest Pain (C/o chest pain and SOB.  Sxs started about 1 mo ago.  Says pain is about the same as when he saw Dr. Darnell Level 3-4 wks ago. )   See prior note for details.  Saw cardiology last month - planned coronary CTA next week then f/u with Dr Rockey Situ. Echocardiogram reassuringly ok - normal EF with minimally leaky tricuspid valve as well as dilated aortic root measuring 79mm. Referred to adult congenital heart specialist at [facility] (03/14/2020).   Notes ongoing chest pain, dyspnea, cough. No fever, hemoptysis.  Worried about fungal infection in the lungs.  Recently remodeled home with black mold - ongoing exposure for 60 days, moved out 2 days ago.  Has cough with mucous, increased wheezing, muscle pain into L shoulder, inability to tolerate exercise (main concern). Has decreased exercise significantly in the past month - every time he tries to exercise or do strenuous activity, notes burning in chest, L shoulder and L chest muscle pains, L chest pressure radiating into neck.  Tested negative for covid yesterday.  Ongoing fatigue, 5 lb weight loss. Has changed diet - steamed vegetables and salmon.  Any time he eats any greasy fatty food (animal fat) notes worsening trouble breathing, fatigue, mental fogginess.   Quit smoking years ago.   CHEST - 2 VIEW CLINICAL DATA: Left chest pain radiating to back, short of breath for 4 days COMPARISON: 05/08/2017 FINDINGS: Frontal and lateral views of the chest demonstrate an unremarkable cardiac silhouette. No airspace disease,  effusion, or pneumothorax. No acute bony abnormalities. IMPRESSION: No active cardiopulmonary disease. Electronically Signed By: Randa Ngo M.D. On: 01/30/2020 01:28     Relevant past medical, surgical, family and social history reviewed and updated as indicated. Interim medical history since our last visit reviewed. Allergies and medications reviewed and updated. Outpatient Medications Prior to Visit  Medication Sig Dispense Refill  . albuterol (VENTOLIN HFA) 108 (90 Base) MCG/ACT inhaler Inhale 2 puffs into the lungs every 4 (four) hours as needed for wheezing or shortness of breath. 18 g 0  . amoxicillin (AMOXIL) 500 MG capsule Take 4 tablets (2,000mg ) 2 hours prior to any dental procedure. 4 capsule 2  . ASPIRIN 81 PO Take by mouth daily.    . hydrOXYzine (ATARAX/VISTARIL) 25 MG tablet Take 0.5-1 tablets (12.5-25 mg total) by mouth 2 (two) times daily as needed for anxiety (sedation precautions). 30 tablet 0  . metoprolol tartrate (LOPRESSOR) 100 MG tablet Take 1 tablet (100mg  total) two hours prior to CT scan. 1 tablet 0   No facility-administered medications prior to visit.     Per HPI unless specifically indicated in ROS section below Review of Systems Objective:    BP 110/72 (BP Location: Left Arm, Patient Position: Sitting, Cuff Size: Normal)   Pulse 84   Temp 98.2 F (36.8 C) (Temporal)   Ht 5\' 7"  (1.702 m)   Wt 157 lb 6 oz (71.4 kg)  SpO2 97%   BMI 24.65 kg/m   Wt Readings from Last 3 Encounters:  02/29/20 157 lb 6 oz (71.4 kg)  02/02/20 162 lb (73.5 kg)  02/01/20 161 lb 8 oz (73.3 kg)    Physical Exam Vitals and nursing note reviewed.  Eyes:     Extraocular Movements: Extraocular movements intact.     Pupils: Pupils are equal, round, and reactive to light.  Cardiovascular:     Rate and Rhythm: Normal rate and regular rhythm.     Pulses: Normal pulses.     Heart sounds: Murmur (3/6 holosystolic) present.  Pulmonary:     Effort: Pulmonary effort is  normal. No respiratory distress.     Breath sounds: Wheezing (mild coarse expiratory wheezing) present. No rhonchi or rales.     Comments: Coarse throughout Psychiatric:        Mood and Affect: Mood normal.        Behavior: Behavior normal.       Results for orders placed or performed in visit on 02/29/20  CBC with Differential/Platelet  Result Value Ref Range   WBC 5.0 4.0 - 10.5 K/uL   RBC 5.37 4.22 - 5.81 Mil/uL   Hemoglobin 16.2 13.0 - 17.0 g/dL   HCT 88.4 16.6 - 06.3 %   MCV 86.2 78.0 - 100.0 fl   MCHC 35.0 30.0 - 36.0 g/dL   RDW 01.6 01.0 - 93.2 %   Platelets 196.0 150.0 - 400.0 K/uL   Neutrophils Relative % 60.8 43.0 - 77.0 %   Lymphocytes Relative 25.7 12.0 - 46.0 %   Monocytes Relative 11.3 3.0 - 12.0 %   Eosinophils Relative 1.6 0.0 - 5.0 %   Basophils Relative 0.6 0.0 - 3.0 %   Neutro Abs 3.1 1.4 - 7.7 K/uL   Lymphs Abs 1.3 0.7 - 4.0 K/uL   Monocytes Absolute 0.6 0.1 - 1.0 K/uL   Eosinophils Absolute 0.1 0.0 - 0.7 K/uL   Basophils Absolute 0.0 0.0 - 0.1 K/uL   DG Chest 2 View CLINICAL DATA:  Dyspnea  EXAM: CHEST - 2 VIEW  COMPARISON:  01/30/2020  FINDINGS: The heart size and mediastinal contours are within normal limits. Both lungs are clear. The visualized skeletal structures are unremarkable. Clips in the upper mediastinum.  IMPRESSION: No active cardiopulmonary disease.  Electronically Signed   By: Jasmine Pang M.D.   On: 02/29/2020 23:56  Assessment & Plan:  This visit occurred during the SARS-CoV-2 public health emergency.  Safety protocols were in place, including screening questions prior to the visit, additional usage of staff PPE, and extensive cleaning of exam room while observing appropriate contact time as indicated for disinfecting solutions.   Problem List Items Addressed This Visit    Systolic murmur   Shortness of breath - Primary    Cardiac eval reassuring pointing against cardiac cause for his noted dyspnea, mildly productive  cough and exercise intolerance. With recent black mold exposure for 60 days, he is worried about fungal infection causing these symptoms. Reasonable to repeat CXR, check CBC for eosinophilia and IgG antibody against aspergillus. If positive, low threshold to refer to pulm for further evaluation. If negative tests, consider allergy eval given recent prolonged mold exposure. In interim, discussed continued albuterol and hydralazine use PRN.       Relevant Orders   CBC with Differential/Platelet (Completed)   DG Chest 2 View (Completed)   Allergen, A fumigatus IgG   Contact with or exposure to mold   Relevant Orders  CBC with Differential/Platelet (Completed)   DG Chest 2 View (Completed)   Allergen, A fumigatus IgG   Congenitally corrected transposition of great arteries   Chest pain    Ongoing had reassuring ER eval last month - not ischemic in nature. Reviewed recent reassuring echo. Planned cardiac CT through cardiology then to establish with congenital heart disease specialist at [facility].       Anxiety state    Stress over cardiac status, lung status, possible fungal infection in h/o exposure. Discussed PRN hydroxyzine use.           No orders of the defined types were placed in this encounter.  Orders Placed This Encounter  Procedures  . DG Chest 2 View    Standing Status:   Future    Number of Occurrences:   1    Standing Expiration Date:   04/30/2021    Order Specific Question:   Reason for Exam (SYMPTOM  OR DIAGNOSIS REQUIRED)    Answer:   dyspnea    Order Specific Question:   Preferred imaging location?    Answer:   [facility]    Order Specific Question:   Radiology Contrast Protocol - do NOT remove file path    Answer:   \\[email or web address]  . CBC with Differential/Platelet  . Allergen, A fumigatus IgG    Patient Instructions  Labs today Xray today Ok to take albuterol as needed Ok to try hydroxyzine.    Follow up  plan: Return if symptoms worsen or fail to improve.  Eustaquio Boyden, MD

## 2020-02-29 NOTE — Telephone Encounter (Signed)
Will see today.  

## 2020-03-01 DIAGNOSIS — Z77120 Contact with and (suspected) exposure to mold (toxic): Secondary | ICD-10-CM | POA: Insufficient documentation

## 2020-03-01 LAB — CBC WITH DIFFERENTIAL/PLATELET
Basophils Absolute: 0 10*3/uL (ref 0.0–0.1)
Basophils Relative: 0.6 % (ref 0.0–3.0)
Eosinophils Absolute: 0.1 10*3/uL (ref 0.0–0.7)
Eosinophils Relative: 1.6 % (ref 0.0–5.0)
HCT: 46.2 % (ref 39.0–52.0)
Hemoglobin: 16.2 g/dL (ref 13.0–17.0)
Lymphocytes Relative: 25.7 % (ref 12.0–46.0)
Lymphs Abs: 1.3 10*3/uL (ref 0.7–4.0)
MCHC: 35 g/dL (ref 30.0–36.0)
MCV: 86.2 fl (ref 78.0–100.0)
Monocytes Absolute: 0.6 10*3/uL (ref 0.1–1.0)
Monocytes Relative: 11.3 % (ref 3.0–12.0)
Neutro Abs: 3.1 10*3/uL (ref 1.4–7.7)
Neutrophils Relative %: 60.8 % (ref 43.0–77.0)
Platelets: 196 10*3/uL (ref 150.0–400.0)
RBC: 5.37 Mil/uL (ref 4.22–5.81)
RDW: 13 % (ref 11.5–15.5)
WBC: 5 10*3/uL (ref 4.0–10.5)

## 2020-03-01 NOTE — Assessment & Plan Note (Signed)
Ongoing had reassuring ER eval last month - not ischemic in nature. Reviewed recent reassuring echo. Planned cardiac CT through cardiology then to establish with congenital heart disease specialist at [facility].

## 2020-03-01 NOTE — Assessment & Plan Note (Signed)
Cardiac eval reassuring pointing against cardiac cause for his noted dyspnea, mildly productive cough and exercise intolerance. With recent black mold exposure for 60 days, he is worried about fungal infection causing these symptoms. Reasonable to repeat CXR, check CBC for eosinophilia and IgG antibody against aspergillus. If positive, low threshold to refer to pulm for further evaluation. If negative tests, consider allergy eval given recent prolonged mold exposure. In interim, discussed continued albuterol and hydralazine use PRN.

## 2020-03-01 NOTE — Assessment & Plan Note (Signed)
Stress over cardiac status, lung status, possible fungal infection in h/o exposure. Discussed PRN hydroxyzine use.

## 2020-03-03 ENCOUNTER — Encounter (HOSPITAL_COMMUNITY): Payer: Self-pay

## 2020-03-03 ENCOUNTER — Other Ambulatory Visit (HOSPITAL_COMMUNITY): Payer: Self-pay | Admitting: Emergency Medicine

## 2020-03-03 ENCOUNTER — Other Ambulatory Visit: Payer: 59

## 2020-03-03 ENCOUNTER — Telehealth (HOSPITAL_COMMUNITY): Payer: Self-pay | Admitting: Emergency Medicine

## 2020-03-03 DIAGNOSIS — I77810 Thoracic aortic ectasia: Secondary | ICD-10-CM

## 2020-03-03 NOTE — Telephone Encounter (Signed)
Reaching out to patient to offer assistance regarding upcoming cardiac imaging study; pt verbalizes understanding of appt date/time, parking situation and where to check in, pre-test NPO status and medications ordered, and verified current allergies; name and call back number provided for further questions should they arise Arron Tetrault RN Navigator Cardiac Imaging [facility] [phone] office [phone] cell 

## 2020-03-04 LAB — ALLERGEN A FUMIGATUS IGG: Aspergillus fumigatus (m3)IgG: 14.1 ug/mL — ABNORMAL HIGH (ref <2.0)

## 2020-03-06 ENCOUNTER — Ambulatory Visit (HOSPITAL_COMMUNITY)
Admission: RE | Admit: 2020-03-06 | Discharge: 2020-03-06 | Disposition: A | Discharge status: Home / Self Care | Payer: 59 | Source: Ambulatory Visit | Attending: Physician Assistant | Admitting: Physician Assistant

## 2020-03-06 ENCOUNTER — Other Ambulatory Visit: Payer: Self-pay | Admitting: Family Medicine

## 2020-03-06 ENCOUNTER — Ambulatory Visit: Payer: 59 | Admitting: Cardiovascular Disease

## 2020-03-06 ENCOUNTER — Other Ambulatory Visit: Payer: Self-pay

## 2020-03-06 DIAGNOSIS — I77810 Thoracic aortic ectasia: Secondary | ICD-10-CM | POA: Diagnosis not present

## 2020-03-06 DIAGNOSIS — R0602 Shortness of breath: Secondary | ICD-10-CM

## 2020-03-06 DIAGNOSIS — Q205 Discordant atrioventricular connection: Secondary | ICD-10-CM | POA: Insufficient documentation

## 2020-03-06 DIAGNOSIS — R05 Cough: Secondary | ICD-10-CM

## 2020-03-06 DIAGNOSIS — R059 Cough, unspecified: Secondary | ICD-10-CM

## 2020-03-06 DIAGNOSIS — B4489 Other forms of aspergillosis: Secondary | ICD-10-CM

## 2020-03-06 MED ORDER — IOHEXOL 350 MG/ML SOLN
80.0000 mL | Freq: Once | INTRAVENOUS | Status: AC | PRN
  Administered 2020-03-06: 80 mL via INTRAVENOUS

## 2020-03-06 MED ORDER — NITROGLYCERIN 0.4 MG SL SUBL
0.8000 mg | SUBLINGUAL_TABLET | Freq: Once | SUBLINGUAL | Status: AC
  Administered 2020-03-06: 14:00:00 0.8 mg via SUBLINGUAL

## 2020-03-06 MED ORDER — NITROGLYCERIN 0.4 MG SL SUBL
SUBLINGUAL_TABLET | SUBLINGUAL | Status: AC
  Filled 2020-03-06: qty 2

## 2020-03-08 ENCOUNTER — Telehealth: Payer: Self-pay

## 2020-03-08 NOTE — Telephone Encounter (Signed)
-----   Message from Sondra Barges, [place] sent at 03/07/2020  7:59 AM EDT ----- Cardiac portion of his coronary CTA showed a calcium score of 0 but no significant coronary artery disease.  Surgically corrected pulmonary artery was noted.  Stable none mildly dilated aortic root and ascending aorta.  Await noncardiac overread from radiology.  Please ensure he follows up with the congenital heart disease clinic as he was recently referred.  Please make sure the physician seeing him without clinic as a copy of this report as well.

## 2020-03-08 NOTE — Telephone Encounter (Signed)
Sondra Barges, PA-C  Danny Russo.Danny Lauf, RN  Noncardiac overread again notes known dilated aortic root with no new findings. Results will be forwarded to congential heart clinic, which the patient has been referred to. Recommend follow up CTA/MRA of the aorta in 12 months.    Call to patient to review coronary CT.    Pt verbalized understanding. pt inquired about cholelithiasis noted by radiologist. I made him aware I would forward results to PCP for further review/POC.    Advised pt to call for any further questions or concerns.  No further orders.  Confirmed upcoming appt with Dr. Mariah Milling 4/19.

## 2020-03-13 ENCOUNTER — Encounter: Payer: Self-pay | Admitting: Family Medicine

## 2020-03-18 NOTE — Progress Notes (Signed)
Cardiology Office Note  Date:  03/20/2020   ID:  Danny Russo, DOB [birth year], MRN [id number]  PCP:  Danny Boyden, MD   Chief Complaint  Patient presents with  . office visit    (went to [facility] to get the results from the CTA). Pt has no complaints. Meds verbally reviewed w/ pt.     HPI:  [name] is a 29 y.o. 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 4.1 cm ascending aortic aneurysm without complicating features. 2. 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 Gallstones  --Valve: 3.7 cm --Sinuses: 4.0 cm --Sinotubular Junction: 3.9 cm  Thoracic Aorta: --Ascending Aorta: 4.1 cm (previously 3.8) --Aortic Arch: 2.4 cm --Descending Aorta: 2.5 cm  Cardiac portion of CT scan 1. No evidence of CAD, CADRADS = 0. 2. Coronary calcium score of 0. This was 0 percentile for age and sex matched control. 3. 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 4. Small anteriorly displaced LAD artery which runs down the anterior wall. 5. Surgically corrected d-Transposition (arterial switch) with anteriorly displaced pulmonary artery. 6. Dilated sinus of valsalva at 38 mm and aortic root to mid-ascending aorta to 4.0 cm.  EKG personally reviewed by myself on todays visit  shows no sinus rhythm with rate 69 bpm, no  significant ST or T-wave changes  Other medical hx Echocardiogram 2017 and 2018 reviewed with him in detail Mildly dilated aorta, stable Aortic root 3.9 cm, up from 3.5 cm in 2016 Normal cardiac function  Echo 3/21 1. Left ventricular ejection fraction, by estimation, is 60 to 65%. The  left ventricle has normal function. The left ventricle has no regional  wall motion abnormalities. Left ventricular diastolic parameters were  normal. The average left ventricular  global longitudinal strain is -21.8 %.  2. Right ventricular systolic function is mildly reduced. The right  ventricular size is mildly enlarged. There is normal pulmonary artery  systolic pressure.  3. The mitral valve is normal in structure and function. No evidence of  mitral valve regurgitation. No evidence of mitral stenosis.  4. The aortic valve is normal in structure and function. Aortic valve  regurgitation is not visualized. No aortic stenosis is present.  5. Ascending aorta is not visualized.. There is dilatation of the aortic  root measuring 41 mm.  6. The inferior vena cava is normal in size with greater than 50%  respiratory variability, suggesting right atrial pressure of 3 mmHg.   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  PSH:    Past Surgical History:  Procedure Laterality Date  . holter  06/2011   rare PAC, PVC  . scrotal US  01/2012   1.3cm epididymal cyst on left, o/w normal  .  TRANSPOSITION OF GREAT VESSELS REPAIR     infant  . US ECHOCARDIOGRAPHY  05/2011   normal bi vent size/fxn, normal flow across all valves, subsystemic R vent pressure, patent aortic arch, no pericardial effusion    Current Outpatient Medications  Medication Sig Dispense Refill  . albuterol (VENTOLIN HFA) 108 (90 Base) MCG/ACT inhaler Inhale 2 puffs into the lungs every 4  (four) hours as needed for wheezing or shortness of breath. 18 g 0  . amoxicillin (AMOXIL) 500 MG capsule Take 4 tablets (2,000mg ) 2 hours prior to any dental procedure. 4 capsule 2  . ASPIRIN 81 PO Take by mouth daily.    . hydrOXYzine (ATARAX/VISTARIL) 25 MG tablet Take 0.5-1 tablets (12.5-25 mg total) by mouth 2 (two) times daily as needed for anxiety (sedation precautions). 30 tablet 0  . metoprolol tartrate (LOPRESSOR) 100 MG tablet Take 1 tablet (100mg  total) two hours prior to CT scan. 1 tablet 0   No current facility-administered medications for this visit.     Allergies:   Patient has no known allergies.   Social History:  The patient  reports that he quit smoking about 5 years ago. His smoking use included cigarettes. He has a 1.00 pack-year smoking history. He has quit using smokeless tobacco.  His smokeless tobacco use included snuff. He reports current alcohol use. He reports that he does not use drugs.   Family History:   family history includes Cancer in his maternal uncle; Coronary artery disease (age of onset: 11) in his maternal uncle; Diabetes in his maternal grandmother.   Review of Systems: Review of Systems  Constitutional: Negative.   Respiratory: Negative.   Cardiovascular: Positive for chest pain.  Gastrointestinal: Negative.   Musculoskeletal: Negative.   Neurological: Negative.   Psychiatric/Behavioral: Negative.   All other systems reviewed and are negative.   PHYSICAL EXAM: VS:  BP 110/74 (BP Location: Left Arm, Patient Position: Sitting, Cuff Size: Normal)   Pulse 69   Ht 5\' 8"  (1.727 m)   Wt 155 lb 4 oz (70.4 kg)   SpO2 98%   BMI 23.61 kg/m  , BMI Body mass index is 23.61 kg/m. Constitutional:  oriented to person, place, and time. No distress.  HENT:  Head: Grossly normal Eyes:  no discharge. No scleral icterus.  Neck: No JVD, no carotid bruits  Cardiovascular: Normal rate, regular rhythm, normal heart sounds and intact distal pulses. Exam  reveals no gallop and no friction rub. No edema 2/6 SEM LSB/RSB Pulmonary/Chest: Clear to auscultation bilaterally, no wheezes or rails Abdominal: Soft.  no distension.  no tenderness.  Musculoskeletal: Normal range of motion Neurological:  normal muscle tone. Coordination normal. No atrophy Skin: Skin warm and dry Psychiatric: normal affect, pleasant   Recent Labs: 07/05/2019: TSH 2.36 01/30/2020: ALT 41; BUN 12; Creatinine, Ser 0.78; Potassium 3.9; Sodium 139 02/29/2020: Hemoglobin 16.2; Platelets 196.0    Lipid Panel Lab Results  Component Value Date   CHOL 87 07/05/2019   HDL 34.70 (L) 07/05/2019   LDLCALC 41 07/05/2019   TRIG 53.0 07/05/2019      Wt Readings from Last 3 Encounters:  03/20/20 155 lb 4 oz (70.4 kg)  02/29/20 157 lb 6 oz (71.4 kg)  02/02/20 162 lb (73.5 kg)      ASSESSMENT AND PLAN:  Transposition of great arteries, corrected Stable murmur echocardiogram , 2021 Recent CT scan also reviewed He has seen congenital clinic at [place]: EKG 12-Lead Stable 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 EKG 12-Lead     Signed, Dossie Arbour, M.D., Ph.D. 03/20/2020  [facility] [place], [place] [phone]

## 2020-03-20 ENCOUNTER — Ambulatory Visit (INDEPENDENT_AMBULATORY_CARE_PROVIDER_SITE_OTHER): Payer: 59 | Admitting: Cardiovascular Disease

## 2020-03-20 ENCOUNTER — Other Ambulatory Visit: Payer: Self-pay

## 2020-03-20 ENCOUNTER — Encounter: Payer: Self-pay | Admitting: Cardiovascular Disease

## 2020-03-20 VITALS — BP 110/74 | HR 69 | Ht 68.0 in | Wt 155.2 lb

## 2020-03-20 DIAGNOSIS — Q205 Discordant atrioventricular connection: Secondary | ICD-10-CM | POA: Diagnosis not present

## 2020-03-20 DIAGNOSIS — I77810 Thoracic aortic ectasia: Secondary | ICD-10-CM

## 2020-03-20 DIAGNOSIS — R072 Precordial pain: Secondary | ICD-10-CM | POA: Diagnosis not present

## 2020-03-20 DIAGNOSIS — F419 Anxiety disorder, unspecified: Secondary | ICD-10-CM

## 2020-03-20 NOTE — Patient Instructions (Signed)

## 2020-04-13 ENCOUNTER — Encounter: Payer: Self-pay | Admitting: Pulmonary Disease

## 2020-04-13 ENCOUNTER — Other Ambulatory Visit: Payer: Self-pay

## 2020-04-13 ENCOUNTER — Ambulatory Visit (INDEPENDENT_AMBULATORY_CARE_PROVIDER_SITE_OTHER): Payer: 59 | Admitting: Pulmonary Disease

## 2020-04-13 ENCOUNTER — Other Ambulatory Visit
Admission: RE | Admit: 2020-04-13 | Discharge: 2020-04-13 | Disposition: A | Discharge status: Home / Self Care | Payer: 59 | Source: Ambulatory Visit | Attending: Pulmonary Disease | Admitting: Pulmonary Disease

## 2020-04-13 VITALS — BP 102/58 | HR 89 | Temp 98.0°F | Ht 68.0 in | Wt 158.0 lb

## 2020-04-13 DIAGNOSIS — B4481 Allergic bronchopulmonary aspergillosis: Secondary | ICD-10-CM | POA: Diagnosis not present

## 2020-04-13 DIAGNOSIS — J45909 Unspecified asthma, uncomplicated: Secondary | ICD-10-CM

## 2020-04-13 DIAGNOSIS — R0602 Shortness of breath: Secondary | ICD-10-CM | POA: Diagnosis not present

## 2020-04-13 NOTE — Progress Notes (Addendum)
Subjective:    [name], male    DOB: [birth year], 29 y.o.   MRN: [id number]  HPI The patient is a 29 year old remote former smoker (quit 2015) who presents for issues of dyspnea which started in or around 30 January 2020.  These issues have lasted till around 02 March 2020.  He is kindly referred by Dr. Ria Bush.  Noticed that he was having some issues after a remodel of his trailer and noted that there was some mold.  Now that he is removed from these areas he has noted marked improvement in his breathing.  He did use albuterol as needed intermittently prior to this visit and noted that that also gave him some relief.  Again getting himself out of the area affected by mold has helped him.  He had an IgG test to Aspergillus fumigatus which was positive at 14.1 mcg/mL.  We are asked to render an opinion as to this.  The patient has had no chest pain, fevers, chills or sweats.  As noted today he is totally asymptomatic.  He has not had any weight loss or anorexia.  He voices no other complaint.  He has not had any weight loss, anorexia, paroxysmal nocturnal dyspnea orthopnea.  No lower extremity edema.  No calf tenderness.  Past Medical History:  Diagnosis Date  . Alcohol use   . History of smoking   . 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 Marijuana smoker in remission ([place])   . Transposition of great arteries, corrected    infant, Dr. Filbert Schilder, no need for SBE ppx, 05/2011 last check WNL, rtc 2 yrs   Past Surgical History:  Procedure Laterality Date  . holter  06/2011   rare PAC, PVC  . scrotal US  01/2012   1.3cm epididymal cyst on left, o/w normal  . TRANSPOSITION OF GREAT VESSELS REPAIR     infant  . US ECHOCARDIOGRAPHY  05/2011   normal bi vent size/fxn, normal flow across all valves, subsystemic R vent pressure, patent aortic arch, no pericardial effusion   Family History  Problem Relation Age of Onset  . Diabetes  Maternal Grandmother   . Coronary artery disease Maternal Uncle 60  . Cancer Maternal Uncle        pancreatic cancer  . Stroke Neg Hx    No Known Allergies  Current Meds  Medication Sig  . albuterol (VENTOLIN HFA) 108 (90 Base) MCG/ACT inhaler Inhale 2 puffs into the lungs every 4 (four) hours as needed for wheezing or shortness of breath.  . hydrOXYzine (ATARAX/VISTARIL) 25 MG tablet Take 0.5-1 tablets (12.5-25 mg total) by mouth 2 (two) times daily as needed for anxiety (sedation precautions).  . [DISCONTINUED] amoxicillin (AMOXIL) 500 MG capsule Take 4 tablets (2,000mg ) 2 hours prior to any dental procedure.    Social history: He is a former smoker having smoked 1 pack of cigarettes per day since age 8 quit in 71.  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  He has no exotic pets.  Has a dog the home.   Review of Systems A 10 point review of systems was performed and it is as noted above otherwise negative.    Objective:   Physical Exam BP (!) 102/58 (BP Location: Right Arm, Cuff Size: Normal)   Pulse 89   Temp 98 F (36.7 C) (Temporal)   Ht 5\' 8"  (1.727 m)  Wt 158 lb (71.7 kg)   SpO2 98%   BMI 24.02 kg/m   GENERAL: Awake, alert, fully ambulatory. HEAD: Normocephalic, atraumatic.  EYES: Pupils equal, round, reactive to light.  No scleral icterus.  MOUTH: Nose/mouth/throat not examined due to masking requirements for COVID 19. NECK: Supple. No thyromegaly.  Trachea midline. No JVD.  PULMONARY: Lungs clear to auscultation bilaterally.  Moving air well. CARDIOVASCULAR: S1 and S2. Regular rate and rhythm.  Grade 2/6 systolic ejection murmur LSB/RSB GASTROINTESTINAL: Benign. MUSCULOSKELETAL: No joint deformity, no clubbing, no edema.  NEUROLOGIC: Awake, alert, speech fluent.  No overt focal deficits. SKIN: Intact,warm,dry.  No rashes noted on limited exam. PSYCH: Mood and behavior normal.  CTA chest for coronary morphology performed 07 March 2020 did not show any pulmonary abnormalities or evidence of tree-in-bud bronchial inflammation.     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  He may just have her sensitivity to Aspergillus that in his case may not be actually consistent with ABPA.  Orders and diagnoses as above.  Will see the patient in follow-up in 2 weeks time.  Instructed to take albuterol as needed.  He is also been instructed to let us know if he has to start using albuterol regularly again as he may need an asthma controller medication.   Gailen Shelter, MD [facility]  *This note was dictated using voice recognition software/Dragon.  Despite best efforts to proofread, errors can occur which can change the meaning.  Any change was purely unintentional.

## 2020-04-13 NOTE — Patient Instructions (Signed)
Will get breathing test done.  We are also going to get a more comprehensive allergen panel and measure of IgE which is antibody it takes care of allergens in your body.   Low up in 4 to 6 weeks time.

## 2020-04-15 LAB — ALLERGEN PANEL (27) + IGE
Alternaria Alternata IgE: <0.1 kU/L
Aspergillus Fumigatus IgE: <0.1 kU/L
Bahia Grass IgE: <0.1 kU/L
Bermuda Grass IgE: <0.1 kU/L
Cat Dander IgE: <0.1 kU/L
Cedar, Mountain IgE: <0.1 kU/L
Cladosporium Herbarum IgE: <0.1 kU/L
Cocklebur IgE: 5.69 kU/L — AB
Cockroach, American IgE: <0.1 kU/L
Common Silver Birch IgE: <0.1 kU/L
D Farinae IgE: 2.49 kU/L — AB
D Pteronyssinus IgE: 2.21 kU/L — AB
Dog Dander IgE: 0.16 kU/L — AB
Elm, American IgE: <0.1 kU/L
Hickory, White IgE: 2.03 kU/L — AB
IgE (Immunoglobulin E), Serum: 34 IU/mL (ref 6–495)
Johnson Grass IgE: <0.1 kU/L
Kentucky Bluegrass IgE: 0.21 kU/L — AB
Maple/Box Elder IgE: <0.1 kU/L
Mucor Racemosus IgE: <0.1 kU/L
Oak, White IgE: <0.1 kU/L
Penicillium Chrysogen IgE: <0.1 kU/L
Pigweed, Rough IgE: <0.1 kU/L
Plantain, English IgE: <0.1 kU/L
Ragweed, Short IgE: 10.1 kU/L — AB
Setomelanomma Rostrat: <0.1 kU/L
Timothy Grass IgE: 0.16 kU/L — AB
White Mulberry IgE: <0.1 kU/L

## 2020-04-18 ENCOUNTER — Telehealth: Payer: Self-pay | Admitting: Pulmonary Disease

## 2020-04-18 DIAGNOSIS — B4481 Allergic bronchopulmonary aspergillosis: Secondary | ICD-10-CM

## 2020-04-18 NOTE — Telephone Encounter (Signed)
Spoke with patient about lab results. Order has been placed to be referred to allergist. Nothing further needed at this time.

## 2020-04-24 ENCOUNTER — Other Ambulatory Visit
Admission: RE | Admit: 2020-04-24 | Discharge: 2020-04-24 | Disposition: A | Discharge status: Home / Self Care | Payer: 59 | Source: Ambulatory Visit | Attending: Pulmonary Disease | Admitting: Pulmonary Disease

## 2020-04-24 ENCOUNTER — Other Ambulatory Visit: Payer: Self-pay

## 2020-04-24 DIAGNOSIS — Z01812 Encounter for preprocedural laboratory examination: Secondary | ICD-10-CM | POA: Diagnosis present

## 2020-04-24 DIAGNOSIS — Z20822 Contact with and (suspected) exposure to covid-19: Secondary | ICD-10-CM | POA: Diagnosis not present

## 2020-04-24 LAB — SARS CORONAVIRUS 2 (TAT 6-24 HRS): SARS Coronavirus 2: NEGATIVE

## 2020-04-26 ENCOUNTER — Other Ambulatory Visit: Payer: Self-pay

## 2020-04-26 ENCOUNTER — Ambulatory Visit (INDEPENDENT_AMBULATORY_CARE_PROVIDER_SITE_OTHER): Payer: 59 | Admitting: Internal Medicine

## 2020-04-26 DIAGNOSIS — J45909 Unspecified asthma, uncomplicated: Secondary | ICD-10-CM

## 2020-04-26 DIAGNOSIS — B4481 Allergic bronchopulmonary aspergillosis: Secondary | ICD-10-CM

## 2020-04-26 DIAGNOSIS — R0602 Shortness of breath: Secondary | ICD-10-CM

## 2020-04-26 LAB — PULMONARY FUNCTION TEST
DL/VA % pred: 108 %
DL/VA: 5.5 ml/min/mmHg/L
DLCO cor % pred: 120 %
DLCO cor: 38.43 ml/min/mmHg
DLCO unc % pred: 120 %
DLCO unc: 38.43 ml/min/mmHg
FEF 25-75 Post: 4.1 L/sec
FEF 25-75 Pre: 3.88 L/sec
FEF2575-%Change-Post: 5 %
FEF2575-%Pred-Post: 89 %
FEF2575-%Pred-Pre: 84 %
FEV1-%Change-Post: 2 %
FEV1-%Pred-Post: 98 %
FEV1-%Pred-Pre: 95 %
FEV1-Post: 4.35 L
FEV1-Pre: 4.24 L
FEV1FVC-%Change-Post: 2 %
FEV1FVC-%Pred-Pre: 93 %
FEV6-%Change-Post: 0 %
FEV6-%Pred-Post: 102 %
FEV6-%Pred-Pre: 102 %
FEV6-Post: 5.47 L
FEV6-Pre: 5.46 L
FEV6FVC-%Change-Post: 0 %
FEV6FVC-%Pred-Post: 100 %
FEV6FVC-%Pred-Pre: 100 %
FVC-%Change-Post: 0 %
FVC-%Pred-Post: 101 %
FVC-%Pred-Pre: 101 %
FVC-Post: 5.47 L
FVC-Pre: 5.47 L
Post FEV1/FVC ratio: 80 %
Post FEV6/FVC ratio: 100 %
Pre FEV1/FVC ratio: 78 %
Pre FEV6/FVC Ratio: 100 %
RV % pred: 52 %
RV: 0.78 L
TLC % pred: 93 %
TLC: 6.26 L

## 2020-04-26 NOTE — Progress Notes (Signed)
Spoke with pt and notified of results per Dr.Gonzalez. Pt verbalized understanding and denied any questions. 

## 2020-04-26 NOTE — Progress Notes (Signed)
Full PFT performed today. °

## 2020-06-08 ENCOUNTER — Ambulatory Visit: Payer: 59 | Admitting: Pulmonary Disease

## 2020-06-08 ENCOUNTER — Encounter: Payer: Self-pay | Admitting: Pulmonary Disease

## 2020-06-08 ENCOUNTER — Other Ambulatory Visit: Payer: Self-pay

## 2020-06-08 VITALS — BP 112/64 | HR 71 | Temp 97.8°F | Ht 68.0 in | Wt 157.4 lb

## 2020-06-08 DIAGNOSIS — R0602 Shortness of breath: Secondary | ICD-10-CM

## 2020-06-08 DIAGNOSIS — S46912A Strain of unspecified muscle, fascia and tendon at shoulder and upper arm level, left arm, initial encounter: Secondary | ICD-10-CM

## 2020-06-08 DIAGNOSIS — J4520 Mild intermittent asthma, uncomplicated: Secondary | ICD-10-CM

## 2020-06-08 NOTE — Progress Notes (Signed)
 Subjective:    [name], male    DOB: 10-29-1991, 29 y.o.   MRN: [id number]  HPI 29 year old former smoker (quit 2015) [facility] for follow-up on dyspnea and possible CPA versus ABPA.  Since his initial visit here he has been doing well.  Particularly after limiting his exposure to moldy areas.  He had a positive IgG to Aspergillus in April 2021 after exposure to mold during a trailer remodel.  Subsequent testing for potential ABPA was negative.  He does have multiple environmental allergies to include dust mites and dust as well as grasses and weeds in particular ragweed.  He will need to monitor for symptoms of asthma in the fall.  He has evidence of active disease and CT of the chest that was performed for cardiac evaluation no groundglass, no infiltrates, no tree-in-bud abnormalities.  Pulmonary function testing done on 26 Apr 2020 showed very minimal obstruction but overall testing was normal.     Review of Systems A 10 point review of systems was performed and it is as noted above otherwise negative.  No Known Allergies  Current Meds  Medication Sig   albuterol  (VENTOLIN  HFA) 108 (90 Base) MCG/ACT inhaler Inhale 2 puffs into the lungs every 4 (four) hours as needed for wheezing or shortness of breath.   [DISCONTINUED] hydrOXYzine  (ATARAX /VISTARIL ) 25 MG tablet Take 0.5-1 tablets (12.5-25 mg total) by mouth 2 (two) times daily as needed for anxiety (sedation precautions).   Immunization History  Administered Date(s) Administered   DTaP 10/19/1993, 12/19/1993, 03/04/1994, 09/18/1994, 01/26/1998   Hepatitis B 12/06/1993, 03/04/1994, 09/18/1994   HiB (PRP-OMP) 10/19/1993, 12/19/1993, 03/04/1994, 11/18/1994   IPV 10/19/1993, 12/19/1993, 09/18/1994, 01/26/1998   MMR 09/18/1994, 01/26/1998   Meningococcal Conjugate 12/07/2004   PPD Test 06/23/2013   Tdap 05/13/2011   Varicella 05/09/1997, 05/27/2013       Objective:   Physical Exam BP 112/64 (BP Location: Left Arm,  Cuff Size: Normal)   Pulse 71   Temp 97.8 F (36.6 C) (Temporal)   Ht 5' 8 (1.727 m)   Wt 157 lb 6.4 oz (71.4 kg)   SpO2 100%   BMI 23.93 kg/m   GENERAL: Awake, alert, fully ambulatory. HEAD: Normocephalic, atraumatic.  EYES: Pupils equal, round, reactive to light.  No scleral icterus.  MOUTH: Nose/mouth/throat not examined due to masking requirements for COVID 19. NECK: Supple. No thyromegaly.  Trachea midline. No JVD.  PULMONARY: Lungs clear to auscultation bilaterally.  Moving air well. CARDIOVASCULAR: S1 and S2. Regular rate and rhythm.  Grade 2-3/6 systolic ejection murmur LSB/RSB. GASTROINTESTINAL: Benign. MUSCULOSKELETAL: No joint deformity, no clubbing, no edema.  There is trapezius muscle point tenderness posteriorly on the left. NEUROLOGIC: Awake, alert, speech fluent.  No overt focal deficits. SKIN: Intact,warm,dry.  No rashes noted on limited exam. PSYCH: Mood and behavior normal.  Physical exam documentation is limited by delayed entry of information.     Assessment & Plan:  1. Mild intermittent asthma in adult without complication (Primary)  2. Shortness of breath  3. Muscle strain of left shoulder, initial encounter   Patient Instructions  We will see you in follow-up in 6 months call sooner should any new problems arise.  For your violin posture issues refer to this video: [email or web address]  Please note: late entry documentation due to logistical difficulties during COVID-19 pandemic. This note is filed for information purposes only, and is not intended to be used for billing, nor does it represent the full scope/nature of the visit in  question. Please see any associated scanned media linked to date of encounter for additional pertinent information.

## 2020-06-08 NOTE — Patient Instructions (Addendum)
We will see you in follow-up in 6 months call sooner should any new problems arise.  For your violin posture issues refer to this video: [email or web address]

## 2020-08-24 ENCOUNTER — Other Ambulatory Visit: Payer: Self-pay | Admitting: Family Medicine

## 2020-08-24 ENCOUNTER — Other Ambulatory Visit (INDEPENDENT_AMBULATORY_CARE_PROVIDER_SITE_OTHER): Payer: 59

## 2020-08-24 ENCOUNTER — Other Ambulatory Visit: Payer: Self-pay

## 2020-08-24 DIAGNOSIS — R7989 Other specified abnormal findings of blood chemistry: Secondary | ICD-10-CM | POA: Diagnosis not present

## 2020-08-24 DIAGNOSIS — Z87898 Personal history of other specified conditions: Secondary | ICD-10-CM

## 2020-08-24 DIAGNOSIS — F411 Generalized anxiety disorder: Secondary | ICD-10-CM

## 2020-08-24 DIAGNOSIS — E786 Lipoprotein deficiency: Secondary | ICD-10-CM | POA: Diagnosis not present

## 2020-08-24 DIAGNOSIS — R0602 Shortness of breath: Secondary | ICD-10-CM

## 2020-08-24 LAB — COMPREHENSIVE METABOLIC PANEL
ALT: 22 U/L (ref 0–53)
AST: 23 U/L (ref 0–37)
Albumin: 4.9 g/dL (ref 3.5–5.2)
Alkaline Phosphatase: 84 U/L (ref 39–117)
BUN: 15 mg/dL (ref 6–23)
CO2: 32 mEq/L (ref 19–32)
Calcium: 9.4 mg/dL (ref 8.4–10.5)
Chloride: 100 mEq/L (ref 96–112)
Creatinine, Ser: 0.93 mg/dL (ref 0.40–1.50)
GFR: 97.45 mL/min (ref >60.00)
Glucose, Bld: 75 mg/dL (ref 70–99)
Potassium: 5 mEq/L (ref 3.5–5.1)
Sodium: 139 mEq/L (ref 135–145)
Total Bilirubin: 0.6 mg/dL (ref 0.2–1.2)
Total Protein: 7.1 g/dL (ref 6.0–8.3)

## 2020-08-24 LAB — CBC WITH DIFFERENTIAL/PLATELET
Basophils Absolute: 0 10*3/uL (ref 0.0–0.1)
Basophils Relative: 0.6 % (ref 0.0–3.0)
Eosinophils Absolute: 0.1 10*3/uL (ref 0.0–0.7)
Eosinophils Relative: 2.1 % (ref 0.0–5.0)
HCT: 45.2 % (ref 39.0–52.0)
Hemoglobin: 15.4 g/dL (ref 13.0–17.0)
Lymphocytes Relative: 27.5 % (ref 12.0–46.0)
Lymphs Abs: 1.4 10*3/uL (ref 0.7–4.0)
MCHC: 34.1 g/dL (ref 30.0–36.0)
MCV: 88.8 fl (ref 78.0–100.0)
Monocytes Absolute: 0.6 10*3/uL (ref 0.1–1.0)
Monocytes Relative: 11.1 % (ref 3.0–12.0)
Neutro Abs: 3 10*3/uL (ref 1.4–7.7)
Neutrophils Relative %: 58.7 % (ref 43.0–77.0)
Platelets: 152 10*3/uL (ref 150.0–400.0)
RBC: 5.09 Mil/uL (ref 4.22–5.81)
RDW: 13.1 % (ref 11.5–15.5)
WBC: 5.1 10*3/uL (ref 4.0–10.5)

## 2020-08-24 LAB — T4, FREE: Free T4: 1.11 ng/dL (ref 0.60–1.60)

## 2020-08-24 LAB — LIPID PANEL
Cholesterol: 65 mg/dL (ref 0–200)
HDL: 30.9 mg/dL — ABNORMAL LOW (ref >39.00)
LDL Cholesterol: 16 mg/dL (ref 0–99)
NonHDL: 34.31
Total CHOL/HDL Ratio: 2
Triglycerides: 91 mg/dL (ref 0.0–149.0)
VLDL: 18.2 mg/dL (ref 0.0–40.0)

## 2020-08-24 LAB — TSH: TSH: 5.1 u[IU]/mL — ABNORMAL HIGH (ref 0.35–4.50)

## 2020-08-28 ENCOUNTER — Encounter: Payer: 59 | Admitting: Family Medicine

## 2021-02-21 ENCOUNTER — Other Ambulatory Visit (INDEPENDENT_AMBULATORY_CARE_PROVIDER_SITE_OTHER): Payer: 59

## 2021-02-21 ENCOUNTER — Other Ambulatory Visit: Payer: Self-pay

## 2021-02-21 ENCOUNTER — Other Ambulatory Visit: Payer: Self-pay | Admitting: Family Medicine

## 2021-02-21 DIAGNOSIS — E786 Lipoprotein deficiency: Secondary | ICD-10-CM

## 2021-02-21 DIAGNOSIS — R7989 Other specified abnormal findings of blood chemistry: Secondary | ICD-10-CM

## 2021-02-21 LAB — BASIC METABOLIC PANEL
BUN: 15 mg/dL (ref 6–23)
CO2: 31 mEq/L (ref 19–32)
Calcium: 9.9 mg/dL (ref 8.4–10.5)
Chloride: 102 mEq/L (ref 96–112)
Creatinine, Ser: 0.84 mg/dL (ref 0.40–1.50)
GFR: 119.51 mL/min (ref >60.00)
Glucose, Bld: 78 mg/dL (ref 70–99)
Potassium: 5.2 mEq/L — ABNORMAL HIGH (ref 3.5–5.1)
Sodium: 140 mEq/L (ref 135–145)

## 2021-02-21 LAB — TSH: TSH: 3.41 u[IU]/mL (ref 0.35–4.50)

## 2021-02-21 LAB — LIPID PANEL
Cholesterol: 84 mg/dL (ref 0–200)
HDL: 34.8 mg/dL — ABNORMAL LOW (ref >39.00)
LDL Cholesterol: 40 mg/dL (ref 0–99)
NonHDL: 49.36
Total CHOL/HDL Ratio: 2
Triglycerides: 46 mg/dL (ref 0.0–149.0)
VLDL: 9.2 mg/dL (ref 0.0–40.0)

## 2021-02-21 LAB — T4, FREE: Free T4: 1.11 ng/dL (ref 0.60–1.60)

## 2021-03-05 ENCOUNTER — Ambulatory Visit (INDEPENDENT_AMBULATORY_CARE_PROVIDER_SITE_OTHER): Payer: 59 | Admitting: Family Medicine

## 2021-03-05 ENCOUNTER — Encounter: Payer: Self-pay | Admitting: Family Medicine

## 2021-03-05 ENCOUNTER — Other Ambulatory Visit: Payer: Self-pay

## 2021-03-05 VITALS — BP 112/66 | HR 72 | Temp 98.2°F | Ht 67.5 in | Wt 153.6 lb

## 2021-03-05 DIAGNOSIS — Z87891 Personal history of nicotine dependence: Secondary | ICD-10-CM | POA: Insufficient documentation

## 2021-03-05 DIAGNOSIS — F411 Generalized anxiety disorder: Secondary | ICD-10-CM | POA: Diagnosis not present

## 2021-03-05 DIAGNOSIS — Z0000 Encounter for general adult medical examination without abnormal findings: Secondary | ICD-10-CM

## 2021-03-05 DIAGNOSIS — K8020 Calculus of gallbladder without cholecystitis without obstruction: Secondary | ICD-10-CM

## 2021-03-05 DIAGNOSIS — R7989 Other specified abnormal findings of blood chemistry: Secondary | ICD-10-CM | POA: Insufficient documentation

## 2021-03-05 DIAGNOSIS — Q205 Discordant atrioventricular connection: Secondary | ICD-10-CM | POA: Diagnosis not present

## 2021-03-05 DIAGNOSIS — N281 Cyst of kidney, acquired: Secondary | ICD-10-CM

## 2021-03-05 DIAGNOSIS — R011 Cardiac murmur, unspecified: Secondary | ICD-10-CM

## 2021-03-05 DIAGNOSIS — I77810 Thoracic aortic ectasia: Secondary | ICD-10-CM

## 2021-03-05 DIAGNOSIS — R0789 Other chest pain: Secondary | ICD-10-CM

## 2021-03-05 DIAGNOSIS — E875 Hyperkalemia: Secondary | ICD-10-CM | POA: Insufficient documentation

## 2021-03-05 NOTE — Assessment & Plan Note (Signed)
Followed regularly by cardiology. 

## 2021-03-05 NOTE — Progress Notes (Signed)
[name], male    DOB: 02-Mar-1991, 30 y.o.   MRN: [id number]  This visit was conducted in person.  BP 112/66   Pulse 72   Temp 98.2 F (36.8 C) (Temporal)   Ht 5' 7.5" (1.715 m)   Wt 153 lb 9 oz (69.7 kg)   SpO2 97%   BMI 23.70 kg/m    CC: CPE Subjective:   HPI: Danny Russo is a 30 y.o. male presenting on 03/05/2021 for Annual Exam   Notes more trouble with salty foods - can cause RUQ abd pain. Also feels fruits increase anxiety.  Notes throat pain over the years. Worried about thyroid disease. He stopped all iodine/salt etc. He's started supplementing iodine.  Finds cruciferous vegetables cause chest discomfort  Mold exposure last year - has moved out of that house.  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   Preventative: Flu shot - declines  COVID vaccine J&J 07/2020  Tdap 05/2011  Seat belt use discussed Sunscreen use discussed. No changing moles on skin.  Ex smoker - quit 2015 Alcohol - none Dentist q6 mo Eye exam - has never seen   Lives alone  Occ: carpet cleaning - wears mask  Activity: enjoys walking and biking Diet: good water, overall healthy changes - see above      Relevant past medical, surgical, family and social history reviewed and updated as indicated. Interim medical history since our last visit reviewed. Allergies and medications reviewed and updated. Outpatient Medications Prior to Visit  Medication Sig Dispense Refill  . Cholecalciferol (VITAMIN D) 50 MCG (2000 UT) tablet 1 tablet    . IODINE EX Apply topically. 150 mcg    . vitamin B-12 (CYANOCOBALAMIN) 1000 MCG tablet 1 tablet    . albuterol (VENTOLIN HFA) 108 (90 Base) MCG/ACT inhaler Inhale 2 puffs into the lungs every 4 (four) hours as needed for wheezing or shortness of breath. 18 g 0   No  facility-administered medications prior to visit.     Per HPI unless specifically indicated in ROS section below Review of Systems  Constitutional: Negative for activity change, appetite change, chills, fatigue, fever and unexpected weight change.  HENT: Negative for hearing loss.   Eyes: Negative for visual disturbance.  Respiratory: Negative for cough, chest tightness, shortness of breath and wheezing.   Cardiovascular: Negative for chest pain, palpitations and leg swelling.  Gastrointestinal: Positive for abdominal pain (RUQ discomfort with salty or high fat foods). Negative for abdominal distention, blood in stool, constipation, diarrhea, nausea and vomiting.  Genitourinary: Negative for difficulty urinating and hematuria.  Musculoskeletal: Negative for arthralgias, myalgias and neck pain.  Skin: Negative for rash.  Neurological: Negative for dizziness, seizures, syncope and headaches.  Hematological: Negative for adenopathy. Does not bruise/bleed easily.  Psychiatric/Behavioral: Negative for dysphoric mood. The patient is nervous/anxious.    Objective:  BP 112/66   Pulse 72   Temp 98.2 F (36.8 C) (Temporal)   Ht 5' 7.5" (1.715 m)   Wt 153 lb 9 oz (69.7 kg)   SpO2 97%   BMI 23.70 kg/m   Wt Readings from Last 3 Encounters:  03/05/21 153 lb 9 oz (69.7 kg)  06/08/20 157 lb 6.4 oz (71.4 kg)  04/13/20 158 lb (71.7 kg)      Physical Exam Vitals and nursing note reviewed.  Constitutional:      General: He is not in acute distress.    Appearance: Normal appearance. He is well-developed. He is not ill-appearing.  HENT:     Head: Normocephalic and atraumatic.     Right Ear: Hearing, tympanic membrane, ear canal and external ear normal.     Left Ear: Hearing, tympanic membrane, ear canal and external ear normal.  Eyes:     General: No scleral icterus.    Extraocular Movements: Extraocular movements intact.     Conjunctiva/sclera: Conjunctivae normal.     Pupils: Pupils are  equal, round, and reactive to light.  Neck:     Thyroid: No thyroid mass or thyromegaly.  Cardiovascular:     Rate and Rhythm: Normal rate and regular rhythm.     Pulses: Normal pulses.          Radial pulses are 2+ on the right side and 2+ on the left side.     Heart sounds: Normal heart sounds. No murmur heard.   Pulmonary:     Effort: Pulmonary effort is normal. No respiratory distress.     Breath sounds: Normal breath sounds. No wheezing, rhonchi or rales.  Abdominal:     General: Abdomen is flat. Bowel sounds are normal. There is no distension.     Palpations: Abdomen is soft. There is no mass.     Tenderness: There is no abdominal tenderness. There is no guarding or rebound.     Hernia: No hernia is present.  Musculoskeletal:        General: Normal range of motion.     Cervical back: Normal range of motion and neck supple.     Right lower leg: No edema.     Left lower leg: No edema.  Lymphadenopathy:     Cervical: No cervical adenopathy.  Skin:    General: Skin is warm and dry.     Findings: No rash.  Neurological:     General: No focal deficit present.     Mental Status: He is alert and oriented to person, place, and time.     Comments: CN grossly intact, station and gait intact  Psychiatric:        Mood and Affect: Mood normal.        Behavior: Behavior normal.        Thought Content: Thought content normal.        Judgment: Judgment normal.       Results for orders placed or performed in visit on 02/21/21  T4, free  Result Value Ref Range   Free T4 1.11 0.60 - 1.60 ng/dL  TSH  Result Value Ref Range   TSH 3.41 0.35 - 4.50 uIU/mL  Basic metabolic panel  Result Value Ref Range   Sodium 140 135 - 145 mEq/L   Potassium 5.2 No hemolysis seen (H) 3.5 - 5.1 mEq/L   Chloride 102 96 - 112 mEq/L   CO2 31 19 - 32 mEq/L   Glucose, Bld 78 70 - 99 mg/dL   BUN 15 6 - 23 mg/dL   Creatinine, Ser 2.02 0.40 - 1.50 mg/dL   GFR 542.70 >62.37 mL/min   Calcium 9.9 8.4 -  10.5 mg/dL  Lipid panel  Result Value Ref Range   Cholesterol 84 0 - 200 mg/dL   Triglycerides 62.8 0.0 - 149.0 mg/dL   HDL 31.51 (L) >76.16 mg/dL   VLDL 9.2 0.0 - 07.3 mg/dL   LDL Cholesterol 40 0 - 99 mg/dL   Total  CHOL/HDL Ratio 2    NonHDL 49.36    Assessment & Plan:  This visit occurred during the SARS-CoV-2 public health emergency.  Safety protocols were in place, including screening questions prior to the visit, additional usage of staff PPE, and extensive cleaning of exam room while observing appropriate contact time as indicated for disinfecting solutions.   Problem List Items Addressed This Visit    Healthcare maintenance - Primary    Preventative protocols reviewed and updated unless pt declined. Discussed healthy diet and lifestyle.       Congenitally corrected transposition of great arteries    Has established with [facility]. Overall stable period, recent cardiac CTA reassuring.       Anxiety state    Comes and goes. Overall stable period.       Chest pain    Attributes intermittent chest discomfort to eating cruciferous vegetables       Systolic murmur    In h/o transposition of great vessels.       Aortic root dilatation (HCC)    Followed regularly by cardiology.       Ex-smoker    Quit 2015, remains abstinent      Abnormal TSH    Latest levels normalized. He is worried about possible Hashimoto's thyroiditis. Will continue to monitor labs, symptoms, thyroid.       Hyperkalemia    Already has high potassium diet (lots of potatoes and vegetables), has recently started supplementing with potassium iodine - rec stop this, reassess symptoms off iodine supplementation.      Gallstones    Describes possible biliary colic - update abd [place] Recent imaging also with possible kidney cyst - will also check this with [place].       Relevant Orders   US Abdomen Complete    Other Visit Diagnoses    Cyst of left kidney       Relevant  Orders   US Abdomen Complete       No orders of the defined types were placed in this encounter.  Orders Placed This Encounter  Procedures  . US Abdomen Complete    Standing Status:   Future    Standing Expiration Date:   03/05/2022    Order Specific Question:   Reason for Exam (SYMPTOM  OR DIAGNOSIS REQUIRED)    Answer:   gallstones, L kidney ?cyst    Order Specific Question:   Preferred imaging location?    Answer:   [facility]    Patient instructions: We will check abdominal ultrasound  Hold iodine supplementation - I think it's contributing to high potassium levels. Continue healthy diet.  Return as needed or in 1 year for next physical.   Follow up plan: Return in about 1 year (around 03/05/2022) for annual exam, prior fasting for blood work.  Eustaquio Boyden, MD

## 2021-03-05 NOTE — Assessment & Plan Note (Signed)
Has established with [facility]. Overall stable period, recent cardiac CTA reassuring.

## 2021-03-05 NOTE — Assessment & Plan Note (Signed)
In h/o transposition of great vessels.

## 2021-03-05 NOTE — Assessment & Plan Note (Signed)
Latest levels normalized. He is worried about possible Hashimoto's thyroiditis. Will continue to monitor labs, symptoms, thyroid.

## 2021-03-05 NOTE — Assessment & Plan Note (Addendum)
Already has high potassium diet (lots of potatoes and vegetables), has recently started supplementing with potassium iodine - rec stop this, reassess symptoms off iodine supplementation.

## 2021-03-05 NOTE — Assessment & Plan Note (Signed)
Attributes intermittent chest discomfort to eating cruciferous vegetables

## 2021-03-05 NOTE — Assessment & Plan Note (Addendum)
Describes possible biliary colic - update abd [place] Recent imaging also with possible kidney cyst - will also check this with [place].

## 2021-03-05 NOTE — Assessment & Plan Note (Signed)
Preventative protocols reviewed and updated unless pt declined. Discussed healthy diet and lifestyle.  

## 2021-03-05 NOTE — Assessment & Plan Note (Addendum)
Quit 2015, remains abstinent

## 2021-03-05 NOTE — Assessment & Plan Note (Signed)
Comes and goes. Overall stable period.

## 2021-03-05 NOTE — Patient Instructions (Addendum)
We will check abdominal ultrasound  Hold iodine supplementation - I think it's contributing to high potassium levels. Continue healthy diet.  Return as needed or in 1 year for next physical.   Health Maintenance, Male Adopting a healthy lifestyle and getting preventive care are important in promoting health and wellness. Ask your health care provider about:  The right schedule for you to have regular tests and exams.  Things you can do on your own to prevent diseases and keep yourself healthy. What should I know about diet, weight, and exercise? Eat a healthy diet  Eat a diet that includes plenty of vegetables, fruits, low-fat dairy products, and lean protein.  Do not eat a lot of foods that are high in solid fats, added sugars, or sodium.   Maintain a healthy weight Body mass index (BMI) is a measurement that can be used to identify possible weight problems. It estimates body fat based on height and weight. Your health care provider can help determine your BMI and help you achieve or maintain a healthy weight. Get regular exercise Get regular exercise. This is one of the most important things you can do for your health. Most adults should:  Exercise for at least 150 minutes each week. The exercise should increase your heart rate and make you sweat (moderate-intensity exercise).  Do strengthening exercises at least twice a week. This is in addition to the moderate-intensity exercise.  Spend less time sitting. Even light physical activity can be beneficial. Watch cholesterol and blood lipids Have your blood tested for lipids and cholesterol at 28 years of age, then have this test every 5 years. You may need to have your cholesterol levels checked more often if:  Your lipid or cholesterol levels are high.  You are older than 28 years of age.  You are at high risk for heart disease. What should I know about cancer screening? Many types of cancers can be detected early and may often  be prevented. Depending on your health history and family history, you may need to have cancer screening at various ages. This may include screening for:  Colorectal cancer.  Prostate cancer.  Skin cancer.  Lung cancer. What should I know about heart disease, diabetes, and high blood pressure? Blood pressure and heart disease  High blood pressure causes heart disease and increases the risk of stroke. This is more likely to develop in people who have high blood pressure readings, are of African descent, or are overweight.  Talk with your health care provider about your target blood pressure readings.  Have your blood pressure checked: ? Every 3-5 years if you are 82-70 years of age. ? Every year if you are 5 years old or older.  If you are between the ages of 49 and 26 and are a current or former smoker, ask your health care provider if you should have a one-time screening for abdominal aortic aneurysm (AAA). Diabetes Have regular diabetes screenings. This checks your fasting blood sugar level. Have the screening done:  Once every three years after age 56 if you are at a normal weight and have a low risk for diabetes.  More often and at a younger age if you are overweight or have a high risk for diabetes. What should I know about preventing infection? Hepatitis B If you have a higher risk for hepatitis B, you should be screened for this virus. Talk with your health care provider to find out if you are at risk for hepatitis B  infection. Hepatitis C Blood testing is recommended for:  Everyone born from [birth year] through 1963.  Anyone with known risk factors for hepatitis C. Sexually transmitted infections (STIs)  You should be screened each year for STIs, including gonorrhea and chlamydia, if: ? You are sexually active and are younger than 28 years of age. ? You are older than 28 years of age and your health care provider tells you that you are at risk for this type of  infection. ? Your sexual activity has changed since you were last screened, and you are at increased risk for chlamydia or gonorrhea. Ask your health care provider if you are at risk.  Ask your health care provider about whether you are at high risk for HIV. Your health care provider may recommend a prescription medicine to help prevent HIV infection. If you choose to take medicine to prevent HIV, you should first get tested for HIV. You should then be tested every 3 months for as long as you are taking the medicine. Follow these instructions at home: Lifestyle  Do not use any products that contain nicotine or tobacco, such as cigarettes, e-cigarettes, and chewing tobacco. If you need help quitting, ask your health care provider.  Do not use street drugs.  Do not share needles.  Ask your health care provider for help if you need support or information about quitting drugs. Alcohol use  Do not drink alcohol if your health care provider tells you not to drink.  If you drink alcohol: ? Limit how much you have to 0-2 drinks a day. ? Be aware of how much alcohol is in your drink. In the [place] one drink equals one 12 oz bottle of beer (355 mL), one 5 oz glass of wine (148 mL), or one 1 oz glass of hard liquor (44 mL). General instructions  Schedule regular health, dental, and eye exams.  Stay current with your vaccines.  Tell your health care provider if: ? You often feel depressed. ? You have ever been abused or do not feel safe at home. Summary  Adopting a healthy lifestyle and getting preventive care are important in promoting health and wellness.  Follow your health care provider's instructions about healthy diet, exercising, and getting tested or screened for diseases.  Follow your health care provider's instructions on monitoring your cholesterol and blood pressure. This information is not intended to replace advice given to you by your health care provider. Make sure you  discuss any questions you have with your health care provider. Document Revised: 11/11/2018 Document Reviewed: 11/11/2018 Elsevier Patient Education  2021 [employer].

## 2021-03-15 ENCOUNTER — Other Ambulatory Visit: Payer: 59

## 2021-03-23 ENCOUNTER — Ambulatory Visit
Admission: RE | Admit: 2021-03-23 | Discharge: 2021-03-23 | Disposition: A | Discharge status: Home / Self Care | Payer: 59 | Source: Ambulatory Visit | Attending: Family Medicine | Admitting: Family Medicine

## 2021-03-23 ENCOUNTER — Ambulatory Visit: Payer: 59 | Admitting: Physician Assistant

## 2021-03-23 DIAGNOSIS — N281 Cyst of kidney, acquired: Secondary | ICD-10-CM

## 2021-03-23 DIAGNOSIS — K8020 Calculus of gallbladder without cholecystitis without obstruction: Secondary | ICD-10-CM

## 2021-03-28 ENCOUNTER — Encounter: Payer: Self-pay | Admitting: Family Medicine

## 2021-03-28 DIAGNOSIS — K668 Other specified disorders of peritoneum: Secondary | ICD-10-CM

## 2021-03-28 DIAGNOSIS — N281 Cyst of kidney, acquired: Secondary | ICD-10-CM

## 2021-03-29 DIAGNOSIS — K668 Other specified disorders of peritoneum: Secondary | ICD-10-CM | POA: Insufficient documentation

## 2021-03-29 DIAGNOSIS — D734 Cyst of spleen: Secondary | ICD-10-CM | POA: Insufficient documentation

## 2021-03-29 NOTE — Telephone Encounter (Signed)
Ct abd w/w/o contrast ordered.

## 2021-04-05 ENCOUNTER — Ambulatory Visit (INDEPENDENT_AMBULATORY_CARE_PROVIDER_SITE_OTHER): Payer: 59 | Admitting: Physician Assistant

## 2021-04-05 ENCOUNTER — Other Ambulatory Visit: Payer: Self-pay

## 2021-04-05 ENCOUNTER — Encounter: Payer: Self-pay | Admitting: Physician Assistant

## 2021-04-05 VITALS — BP 108/68 | HR 71 | Ht 67.5 in | Wt 153.0 lb

## 2021-04-05 DIAGNOSIS — Z01818 Encounter for other preprocedural examination: Secondary | ICD-10-CM

## 2021-04-05 DIAGNOSIS — R072 Precordial pain: Secondary | ICD-10-CM

## 2021-04-05 DIAGNOSIS — R011 Cardiac murmur, unspecified: Secondary | ICD-10-CM | POA: Diagnosis not present

## 2021-04-05 DIAGNOSIS — K668 Other specified disorders of peritoneum: Secondary | ICD-10-CM

## 2021-04-05 DIAGNOSIS — Q205 Discordant atrioventricular connection: Secondary | ICD-10-CM

## 2021-04-05 DIAGNOSIS — Z01812 Encounter for preprocedural laboratory examination: Secondary | ICD-10-CM

## 2021-04-05 DIAGNOSIS — I77810 Thoracic aortic ectasia: Secondary | ICD-10-CM

## 2021-04-05 DIAGNOSIS — K8020 Calculus of gallbladder without cholecystitis without obstruction: Secondary | ICD-10-CM

## 2021-04-05 NOTE — Progress Notes (Signed)
Office Visit    Patient Name: Danny Russo Date of Encounter: 04/05/2021  PCP:  Eustaquio Boyden, MD   [facility]  Cardiologist:  Julien Nordmann, MD  Advanced Practice Provider:  No care team member to display Electrophysiologist:  None   Chief Complaint    Chief Complaint  Patient presents with  . Follow-up    Annual follow up. Medications verbally reviewed with patient.     30 y.o. 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  Past Medical History    Past Medical History:  Diagnosis Date  . Alcohol use   . History of smoking   . 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 Transposition of great arteries, corrected    infant, Dr. Elizebeth Brooking, no need for SBE ppx, 05/2011 last check WNL, rtc 2 yrs   Past Surgical History:  Procedure Laterality Date  . holter  06/2011   rare PAC, PVC  . scrotal US  01/2012   1.3cm epididymal cyst on left, o/w normal  . TRANSPOSITION OF GREAT VESSELS REPAIR     infant  . US ECHOCARDIOGRAPHY  05/2011   normal bi vent size/fxn, normal flow across all valves, subsystemic R vent pressure, patent aortic arch, no pericardial effusion    Allergies  No Known Allergies  History of Present Illness    Danny Russo is a 30 y.o. male with PMH as above.   Remote cardiac monitoring showed rare PACs and PVCs.  2017 echo EF 60 to 65%, NRWMA, mildly dilated aortic root measuring 3.9 cm, mild MR.  2017 CTA of the aorta showed mild ectasia of the ascending aorta 3.8 cm along with repair of transposition of the great vessels.    2018 echo EF 55 to 60%, mildly dilated aortic root of 3.8 cm, mildly dilated ascending aorta, mildly dilated RV with mildly reduced RVSF.    Seen in office 2019 and doing well without  significant chest pain.  He was seen in the ED 01/2020 with 4-day history of left-sided chest tightness that radiated to his back with occasional shortness of breath.  Symptoms correlated with food consumption and improved with nebulizer therapy.  He was discharged with albuterol.    Seen 01/2020 and wondered if he was sensitive to caffeine.  He had used his albuterol once.  He continued to note occasional left-sided chest pressure and burning that radiated to the left upper shoulder.  Symptoms typically lasted 10 to 15 minutes and were associated with some shortness of breath.  He was seen by his PCP and prescribed hydroxyzine for possible anxiety.  He was last seen 03/20/2020 by his primary cardiologist.  At that time, he wondered if his symptoms were due to mold.  He was seen by pulmonary with Aspergillus antibody test.  He reported coughing up green mucus 3 to 4 weeks prior, which was improving.  Recent echo and CT of the chest reviewed.  Cardiac CT also reviewed with CADRADS score of 0 and coronary artery calcium score of 0.   Seen 04/05/2021 and doing well from a cardiac standpoint.  He reports recent ultrasound with a cyst on his kidneys, for which she is undergoing work-up with CT abdomen to be performed in [place].  He denies any further  chest pain or right shoulder pain as reported in the past.  Since that time, cholelithiasis has been identified through his scans as indicated below under CV studies.  No racing heart rate.  He does report palpitations from time to time, especially when working out.  Recent labs reviewed with patient report that he attributes higher potassium to eating high potassium foods and root vegetables.  We reviewed his recent cardiac history and coronary artery score/coronary CT with patient understanding.  No signs or symptoms of volume overload reported.  Recommendations regarding aortic root dilation discussed and provided on his after visit summary.  Home Medications    Current Outpatient Medications  Medication Instructions  . Cholecalciferol (VITAMIN D) 50 MCG (2000 UT) tablet 1 tablet  . vitamin B-12 (CYANOCOBALAMIN) 1000 MCG tablet 1 tablet     Review of Systems    He denies further chest pain or right shoulder pain.  He has occasional palpitations when working out.  No dyspnea, pnd, orthopnea, n, v, dizziness, syncope, edema, weight gain, or early satiety. .   All other systems reviewed and are otherwise negative except as noted above.  Physical Exam    VS:  BP 108/68 (BP Location: Left Arm, Patient Position: Sitting, Cuff Size: Normal)   Pulse 71   Ht 5' 7.5" (1.715 m)   Wt 153 lb (69.4 kg)   SpO2 99%   BMI 23.61 kg/m  , BMI Body mass index is 23.61 kg/m. GEN: Well nourished, well developed, in no acute distress. HEENT: normal. Neck: Supple, no JVD, carotid bruits, or masses. Cardiac: RRR,II/VI holosystolic murmur, rubs, or gallops. No clubbing, cyanosis, edema.  Radials/DP/PT 2+ and equal bilaterally.  Respiratory:  Respirations regular and unlabored, clear to auscultation bilaterally. GI: Soft, nontender, nondistended, BS + x 4. MS: no deformity or atrophy. Skin: warm and dry, no rash. Neuro:  Strength and sensation are intact. Psych: Normal affect.  Accessory Clinical Findings    ECG personally reviewed by me today - NSR, RAD, incomplete RBBB, IVCD PRi136ms, QRS 106, LVH- no acute changes.  VITALS Reviewed today   Temp Readings from Last 3 Encounters:  03/05/21 98.2 F (36.8 C) (Temporal)  06/08/20 97.8 F (36.6 C) (Temporal)  04/13/20 98 F (36.7 C) (Temporal)   BP Readings from Last 3 Encounters:  04/05/21 108/68  03/05/21 112/66  06/08/20 112/64   Pulse Readings from Last 3 Encounters:  04/05/21 71  03/05/21 72  06/08/20 71    Wt Readings from Last 3 Encounters:  04/05/21 153 lb (69.4 kg)  03/05/21 153 lb 9 oz (69.7 kg)  06/08/20 157 lb 6.4 oz (71.4 kg)     LABS  reviewed today   Lab Results  Component  Value Date   WBC 5.1 08/24/2020   HGB 15.4 08/24/2020   HCT 45.2 08/24/2020   MCV 88.8 08/24/2020   PLT 152.0 08/24/2020   Lab Results  Component Value Date   CREATININE 0.84 02/21/2021   BUN 15 02/21/2021   NA 140 02/21/2021   K 5.2 No hemolysis seen (H) 02/21/2021   CL 102 02/21/2021   CO2 31 02/21/2021   Lab Results  Component Value Date   ALT 22 08/24/2020   AST 23 08/24/2020   ALKPHOS 84 08/24/2020   BILITOT 0.6 08/24/2020   Lab Results  Component Value Date   CHOL 84 02/21/2021   HDL 34.80 (L) 02/21/2021   LDLCALC 40 02/21/2021   TRIG 46.0 02/21/2021   CHOLHDL 2 02/21/2021  No results found for: HGBA1C Lab Results  Component Value Date   TSH 3.41 02/21/2021     STUDIES/PROCEDURES reviewed today   CTA  03/07/20 IMPRESSION: 1. 4.1 cm ascending aortic aneurysm without complicating features. Recommend annual imaging followup by CTA or MRA. This recommendation follows 2010 ACCF/AHA/AATS/ACR/ASA/SCA/SCAI/SIR/STS/SVM Guidelines for the Diagnosis and Management of Patients with Thoracic Aortic Disease. Circulation. 2010; 121: E081-K481. Aortic aneurysm NOS (ICD10-I71.9) 2. 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 3. Cholelithiasis.  Coronary CT 03/06/20 IMPRESSION: 1. No evidence of CAD, CADRADS = 0. 2. Coronary calcium score of 0. This was 0 percentile for age and sex matched control. 3. 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 4. Small anteriorly displaced LAD artery which runs down the anterior wall. 5. Surgically corrected d-Transposition (arterial switch) with anteriorly displaced pulmonary artery. 6. Dilated sinus of valsalva at 38 mm and aortic root to mid-ascending aorta to 4.0 cm.  Echo 01/2020 1. Left ventricular ejection fraction, by estimation, is 60 to 65%. The  left ventricle has normal function.  The left ventricle has no regional  wall motion abnormalities. Left ventricular diastolic parameters were  normal. The average left ventricular  global longitudinal strain is -21.8 %.  2. Right ventricular systolic function is mildly reduced. The right  ventricular size is mildly enlarged. There is normal pulmonary artery  systolic pressure.  3. The mitral valve is normal in structure and function. No evidence of  mitral valve regurgitation. No evidence of mitral stenosis.  4. The aortic valve is normal in structure and function. Aortic valve  regurgitation is not visualized. No aortic stenosis is present.  5. Ascending aorta is not visualized.. There is dilatation of the aortic  root measuring 41 mm.  6. The inferior vena cava is normal in size with greater than 50%  respiratory variability, suggesting right atrial pressure of 3 mmHg. Comparison(s): 07/23/17 EF 55-60%.   Assessment & Plan    Chest pain, resolved -- No recent chest pain or right shoulder pain as reported in the past.  Cholelithiasis identified on previous scans, which likely was at least in part contributing to his symptoms, given R shoulder pain.  2021 Cardiac CT, Chest CTA Ao, and echo reviewed.  03/2020 Cardiac CT with coronary artery calcium score of 0, discussed is reassuring. Also reviewed was Ao root dilation, measured 47mm per 01/2020 echo and subsequent 03/2020 CTA chest aorta  with recommendations for annual monitoring as below. Echo 01/2020 with EF normal, NRWMA, mild RVE, mildly reduced RVSF.  Recommend ongoing HR and BP control.  Ongoing risk factor modification discussed with pt understanding.  No further ischemic work-up indicated at this time. Continue current medications.   Ao root dilation, 41 mm --Recommendations regarding aortic root dilation, risk factor control, and monitoring reviewed and provided on after visit summary.  Previous 201 echo with 41 mm dilation of aortic root. Subsequent CTA Chest Ao  measurement of 41 mm as well.  Will obtain guideline directed annual echo and CTA chest as directed per ACC guidelines this year.  Ongoing heart rate and blood pressure control recommended with vitals well-controlled today.  Continue to control cholesterol as in the past with LDL goal below 70.  Reviewed recommendations to avoid fluoroquinolones and heavy lifting.  Further recommendations, if indicated, following annual imaging.  Unfortunately, we are unable to coordinate CT scans so that his chest CTA can be performed  at the same time as his abdomen CTA, though [facility]  Holosystolic murmur/transposition of the great vessels -- S/p surgical repair as an infant.  Ordered echo as above for monitoring with 2/6 holosystolic murmur appreciated during cardiac auscultation today.  As above, risk factor modification recommended.  Vitals well controlled today.  Most recent LDL at goal.  Reviewed recommendations for diet and exercise.  Continue current medications.  Palpitations --Occasional and only occurring during workouts. EKG SR without ectopy. Continue current medications. If increase in palpitations, could consider Zio in the future. Will defer for now, given infrequency of palpitations. Continue heart healthy diet and activity as above.   Renal cyst / Cholelithiasis --Per GI/surgery.   Medication changes: None Labs ordered: BMET Studies / Imaging ordered: Chest CTA for aortic root dilation, echo for holosystolic murmur with transposition of the great vessels and aortic root dilation Disposition: RTC 1 year, sooner if needed  *Please be aware that the above documentation was completed voice recognition software and may contain dictation errors.      Lennon AlstromJacquelyn D Rosemaria Inabinet, PA-C 04/05/2021

## 2021-04-05 NOTE — Progress Notes (Incomplete)
Office Visit    Patient Name: Danny Russo Date of Encounter: 04/05/2021  PCP:  Eustaquio Boyden, MD   [facility]  Cardiologist:  Julien Nordmann, MD  Advanced Practice Provider:  No care team member to display Electrophysiologist:  None   Chief Complaint    Chief Complaint  Patient presents with  . Follow-up    Annual follow up. Medications verbally reviewed with patient.     30 y.o. 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  Past Medical History    Past Medical History:  Diagnosis Date  . Alcohol use   . History of smoking   . 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 Transposition of great arteries, corrected    infant, Dr. Elizebeth Brooking, no need for SBE ppx, 05/2011 last check WNL, rtc 2 yrs   Past Surgical History:  Procedure Laterality Date  . holter  06/2011   rare PAC, PVC  . scrotal US  01/2012   1.3cm epididymal cyst on left, o/w normal  . TRANSPOSITION OF GREAT VESSELS REPAIR     infant  . US ECHOCARDIOGRAPHY  05/2011   normal bi vent size/fxn, normal flow across all valves, subsystemic R vent pressure, patent aortic arch, no pericardial effusion    Allergies  No Known Allergies  History of Present Illness    Danny Russo is a 30 y.o. male with PMH as above.   Remote cardiac monitoring showed rare PACs and PVCs.  2017 echo EF 60 to 65%, NRWMA, mildly dilated aortic root measuring 3.9 cm, mild MR.  2017 CTA of the aorta showed mild ectasia of the ascending aorta 3.8 cm along with repair of transposition of the great vessels.    2018 echo EF 55 to 60%, mildly dilated aortic root of 3.8 cm, mildly dilated ascending aorta, mildly dilated RV with mildly reduced RVSF.    Seen in office 2019 and doing well without  significant chest pain.  He was seen in the ED 01/2020 with 4-day history of left-sided chest tightness that radiated to his back with occasional shortness of breath.  Symptoms correlated with food consumption and improved with nebulizer therapy.  He was discharged with albuterol.    Seen 01/2020 and wondered if he was sensitive to caffeine.  He had used his albuterol once.  He continued to note occasional left-sided chest pressure and burning that radiated to the left upper shoulder.  Symptoms typically lasted 10 to 15 minutes and were associated with some shortness of breath.  He was seen by his PCP and prescribed hydroxyzine for possible anxiety.  He was last seen 03/20/2020 by his primary cardiologist.  At that time, he wondered if his symptoms were due to mold.  He was seen by pulmonary with Aspergillus antibody test.  He reported coughing up green mucus 3 to 4 weeks prior, which was improving.  Recent echo and CT of the chest reviewed.  Cardiac CT also reviewed with CADRADS score of 0 and coronary artery calcium score of 0.      Recently found a cyst on his kidneys Cyst   No racing HR  Palpitations from time to time, especially when working out High potassium - high potassium foods No real amount of further CP or  right shoulder pain About to meet with his surgeon   Seems to be related to vegetables Breathing OK      Home Medications   Current Outpatient Medications  Medication Instructions  . Cholecalciferol (VITAMIN D) 50 MCG (2000 UT) tablet 1 tablet  . vitamin B-12 (CYANOCOBALAMIN) 1000 MCG tablet 1 tablet     Review of Systems    He denies chest pain, palpitations, dyspnea, pnd, orthopnea, n, v, dizziness, syncope, edema, weight gain, or early satiety. .   All other systems reviewed and are otherwise negative except as noted above.  Physical Exam    VS:  BP 108/68 (BP Location: Left Arm, Patient Position: Sitting, Cuff Size: Normal)   Pulse 71   Ht 5' 7.5" (1.715 m)    Wt 153 lb (69.4 kg)   SpO2 99%   BMI 23.61 kg/m  , BMI Body mass index is 23.61 kg/m. GEN: Well nourished, well developed, in no acute distress. HEENT: normal. Neck: Supple, no JVD, carotid bruits, or masses. Cardiac: RRR,II/VI holosystolic murmur, rubs, or gallops. No clubbing, cyanosis, edema.  Radials/DP/PT 2+ and equal bilaterally.  Respiratory:  Respirations regular and unlabored, clear to auscultation bilaterally. GI: Soft, nontender, nondistended, BS + x 4. MS: no deformity or atrophy. Skin: warm and dry, no rash. Neuro:  Strength and sensation are intact. Psych: Normal affect.  Accessory Clinical Findings    ECG personally reviewed by me today - NSR, RAD, incomplete RBBB, IVCD pRi167ms, QRS 106, IVCD, LVH- no acute changes.  VITALS Reviewed today   Temp Readings from Last 3 Encounters:  03/05/21 98.2 F (36.8 C) (Temporal)  06/08/20 97.8 F (36.6 C) (Temporal)  04/13/20 98 F (36.7 C) (Temporal)   BP Readings from Last 3 Encounters:  04/05/21 108/68  03/05/21 112/66  06/08/20 112/64   Pulse Readings from Last 3 Encounters:  04/05/21 71  03/05/21 72  06/08/20 71    Wt Readings from Last 3 Encounters:  04/05/21 153 lb (69.4 kg)  03/05/21 153 lb 9 oz (69.7 kg)  06/08/20 157 lb 6.4 oz (71.4 kg)     LABS  reviewed today   Lab Results  Component Value Date   WBC 5.1 08/24/2020   HGB 15.4 08/24/2020   HCT 45.2 08/24/2020   MCV 88.8 08/24/2020   PLT 152.0 08/24/2020   Lab Results  Component Value Date   CREATININE 0.84 02/21/2021   BUN 15 02/21/2021   NA 140 02/21/2021   K 5.2 No hemolysis seen (H) 02/21/2021   CL 102 02/21/2021   CO2 31 02/21/2021   Lab Results  Component Value Date   ALT 22 08/24/2020   AST 23 08/24/2020   ALKPHOS 84 08/24/2020   BILITOT 0.6 08/24/2020   Lab Results  Component Value Date   CHOL 84 02/21/2021   HDL 34.80 (L) 02/21/2021   LDLCALC 40 02/21/2021   TRIG 46.0 02/21/2021   CHOLHDL 2 02/21/2021    No results  found for: HGBA1C Lab Results  Component Value Date   TSH 3.41 02/21/2021     STUDIES/PROCEDURES reviewed today   Cardiac CT IMPRESSION: 1. 4.1 cm ascending aortic aneurysm without complicating features. Recommend annual imaging followup by CTA or MRA. This recommendation follows 2010 ACCF/AHA/AATS/ACR/ASA/SCA/SCAI/SIR/STS/SVM Guidelines for the Diagnosis and Management of Patients with Thoracic Aortic Disease. Circulation. 2010; 121: W979-G921. Aortic aneurysm NOS (ICD10-I71.9) 2. 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 3. Cholelithiasis.   Echo 01/2020 1. Left  ventricular ejection fraction, by estimation, is 60 to 65%. The  left ventricle has normal function. The left ventricle has no regional  wall motion abnormalities. Left ventricular diastolic parameters were  normal. The average left ventricular  global longitudinal strain is -21.8 %.  2. Right ventricular systolic function is mildly reduced. The right  ventricular size is mildly enlarged. There is normal pulmonary artery  systolic pressure.  3. The mitral valve is normal in structure and function. No evidence of  mitral valve regurgitation. No evidence of mitral stenosis.  4. The aortic valve is normal in structure and function. Aortic valve  regurgitation is not visualized. No aortic stenosis is present.  5. Ascending aorta is not visualized.. There is dilatation of the aortic  root measuring 41 mm.  6. The inferior vena cava is normal in size with greater than 50%  respiratory variability, suggesting right atrial pressure of 3 mmHg. Comparison(s): 07/23/17 EF 55-60%.   Assessment & Plan    ***  Medication changes: *** Labs ordered: *** Studies / Imaging ordered: *** Future considerations: *** Disposition: ***  *Please be aware that the above documentation was completed voice recognition software and may contain dictation  errors.    Total time spent with patient today *** minutes. This includes reviewing records, evaluating the patient, and coordinating care. Face-to-face time >50%.    Lennon Alstrom, PA-C 04/05/2021

## 2021-04-05 NOTE — Patient Instructions (Addendum)
Medication Instructions:  - Your physician recommends that you continue on your current medications as directed. Please refer to the Current Medication list given to you today.  *If you need a refill on your cardiac medications before your next appointment, please call your pharmacy*   Lab Work: - Your physician recommends that you have lab work today: BMP   If you have labs (blood work) drawn today and your tests are completely normal, you will receive your results only by: Marland Kitchen MyChart Message (if you have MyChart) OR . A paper copy in the mail If you have any lab test that is abnormal or we need to change your treatment, we will call you to review the results.   Testing/Procedures:  1) Echocardiogram: - Your physician has requested that you have an echocardiogram. Echocardiography is a painless test that uses sound waves to create images of your heart. It provides your doctor with information about the size and shape of your heart and how well your heart's chambers and valves are working. This procedure takes approximately one hour. There are no restrictions for this procedure. There is a possibility that an IV may need to be started during your test to inject an image enhancing agent. This is done to obtain more optimal pictures of your heart. Therefore we ask that you do at least drink some water prior to coming in to hydrate your veins.     2) CT angiogram of the chest/ aorta - Thursday 04/16/21 @ 9:20 am (arrive at 9:00 am) - [address]  - NO solid food for 4 hours prior to the test - Liquids are ok up until the time of the test  Follow-Up: At [facility], you and your health needs are our priority.  As part of our continuing mission to provide you with exceptional heart care, we have created designated Provider Care Teams.  These Care Teams include your primary Cardiologist (physician) and Advanced Practice Providers (APPs -  Physician Assistants and Nurse  Practitioners) who all work together to provide you with the care you need, when you need it.  We recommend signing up for the patient portal called "MyChart".  Sign up information is provided on this After Visit Summary.  MyChart is used to connect with patients for Virtual Visits (Telemedicine).  Patients are able to view lab/test results, encounter notes, upcoming appointments, etc.  Non-urgent messages can be sent to your provider as well.   To learn more about what you can do with MyChart, go to [email or web address].    Your next appointment:   1 year(s)  The format for your next appointment:   In Person  Provider:   You may see Julien Nordmann, MD or one of the following Advanced Practice Providers on your designated Care Team:    Nicolasa Ducking, NP  Eula Listen, PA-C  Marisue Ivan, PA-C  Cadence Fransico Michael, [place]  Gillian Shields, NP    Other Instructions    Information About Your Aneurysm  The word "aneurysm" refers to a bulge in an artery (blood vessel). Most people think of them in the context of an emergency, but yours was found incidentally. At this point there is nothing you need to do from a procedure standpoint, but there are some important things to keep in mind for day-to-day life.  Mainstays of therapy for aneurysms include very good blood pressure control, healthy lifestyle, and avoiding tobacco products and street drugs. Research has raised concern that antibiotics in the fluoroquinolone class  could be associated with increased risk of having an aneurysm develop or tear. This includes medicines that end in "floxacin," like Cipro or Levaquin. Make sure to discuss this information with other healthcare providers if you require antibiotics.  Since aneurysms can run in families, you should discuss your diagnosis with first degree relatives as they may need to be screened for this. Regular mild-moderate physical exercise is important, but avoid heavy lifting/weight  lifting over 30lbs, chopping wood, shoveling snow or digging heavy earth with a shovel. It is best to avoid activities that cause grunting or straining (medically referred to as a "Valsalva maneuver"). This happens when a person bears down against a closed throat to increase the strength of arm or abdominal muscles. There's often a tendency to do this when lifting heavy weights, doing sit-ups, push-ups or chin-ups, etc., but it may be harmful.  This is a finding I would expect to be monitored periodically by your cardiology team. Most unruptured thoracic aortic aneurysms cause no symptoms, so they are often found during exams for other conditions. Contact a health care provider if you develop any discomfort in your upper back, neck, abdomen, trouble swallowing, cough or hoarseness, or unexplained weight loss. Get help right away if you develop severe pain in your upper back or abdomen that may move into your chest and arms, or any other concerning symptoms such as shortness of breath or fever.

## 2021-04-06 ENCOUNTER — Encounter: Payer: Self-pay | Admitting: Physician Assistant

## 2021-04-06 LAB — BASIC METABOLIC PANEL
BUN/Creatinine Ratio: 13 (ref 9–20)
BUN: 12 mg/dL (ref 6–20)
CO2: 26 mmol/L (ref 20–29)
Calcium: 9.5 mg/dL (ref 8.7–10.2)
Chloride: 99 mmol/L (ref 96–106)
Creatinine, Ser: 0.89 mg/dL (ref 0.76–1.27)
Glucose: 76 mg/dL (ref 65–99)
Potassium: 4.8 mmol/L (ref 3.5–5.2)
Sodium: 141 mmol/L (ref 134–144)
eGFR: 120 mL/min/{1.73_m2} (ref >59)

## 2021-04-12 ENCOUNTER — Ambulatory Visit
Admission: RE | Admit: 2021-04-12 | Discharge: 2021-04-12 | Disposition: A | Discharge status: Home / Self Care | Payer: 59 | Source: Ambulatory Visit | Attending: Physician Assistant | Admitting: Physician Assistant

## 2021-04-12 ENCOUNTER — Inpatient Hospital Stay: Admission: RE | Admit: 2021-04-12 | Payer: 59 | Source: Ambulatory Visit

## 2021-04-12 ENCOUNTER — Other Ambulatory Visit: Payer: Self-pay

## 2021-04-12 ENCOUNTER — Ambulatory Visit
Admission: RE | Admit: 2021-04-12 | Discharge: 2021-04-12 | Disposition: A | Discharge status: Home / Self Care | Payer: 59 | Source: Ambulatory Visit | Attending: Family Medicine | Admitting: Family Medicine

## 2021-04-12 DIAGNOSIS — I77810 Thoracic aortic ectasia: Secondary | ICD-10-CM

## 2021-04-12 DIAGNOSIS — N281 Cyst of kidney, acquired: Secondary | ICD-10-CM

## 2021-04-12 MED ORDER — IOPAMIDOL (ISOVUE-370) INJECTION 76%
75.0000 mL | Freq: Once | INTRAVENOUS | Status: AC | PRN
  Administered 2021-04-12: 75 mL via INTRAVENOUS

## 2021-04-16 ENCOUNTER — Other Ambulatory Visit: Payer: Self-pay | Admitting: Family Medicine

## 2021-04-16 ENCOUNTER — Telehealth: Payer: Self-pay | Admitting: *Deleted

## 2021-04-16 DIAGNOSIS — K668 Other specified disorders of peritoneum: Secondary | ICD-10-CM

## 2021-04-16 DIAGNOSIS — K8020 Calculus of gallbladder without cholecystitis without obstruction: Secondary | ICD-10-CM

## 2021-04-16 NOTE — Telephone Encounter (Signed)
Left voicemail message to call back for review of results.  

## 2021-04-16 NOTE — Telephone Encounter (Signed)
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  We will continue to monitor with annual imaging.  Very reassuring finding.  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  This is an already known finding (discussed with pt during visit) and pt is currently undergoing work-up by GI/surgery.  Will defer work-up/management to GI/surgery.  No further or additional recommendations from that of clinic.

## 2021-04-17 ENCOUNTER — Encounter: Payer: Self-pay | Admitting: Family Medicine

## 2021-04-17 DIAGNOSIS — K8020 Calculus of gallbladder without cholecystitis without obstruction: Secondary | ICD-10-CM

## 2021-04-17 DIAGNOSIS — K668 Other specified disorders of peritoneum: Secondary | ICD-10-CM

## 2021-04-19 ENCOUNTER — Other Ambulatory Visit: Payer: 59

## 2021-05-10 ENCOUNTER — Other Ambulatory Visit: Payer: 59

## 2021-05-31 ENCOUNTER — Ambulatory Visit (INDEPENDENT_AMBULATORY_CARE_PROVIDER_SITE_OTHER): Payer: 59

## 2021-05-31 ENCOUNTER — Other Ambulatory Visit: Payer: Self-pay

## 2021-05-31 DIAGNOSIS — R011 Cardiac murmur, unspecified: Secondary | ICD-10-CM | POA: Diagnosis not present

## 2021-05-31 LAB — ECHOCARDIOGRAM COMPLETE
AR max vel: 4.4 cm2
AV Area VTI: 4.57 cm2
AV Area mean vel: 4.6 cm2
AV Mean grad: 2 mmHg
AV Peak grad: 4.7 mmHg
Ao pk vel: 1.08 m/s
Area-P 1/2: 3.6 cm2
Calc EF: 54.1 %
S' Lateral: 3.8 cm
Single Plane A2C EF: 51.3 %
Single Plane A4C EF: 55.6 %

## 2021-06-02 NOTE — Addendum Note (Signed)
Addended by: Eustaquio Boyden on: 06/02/2021 10:36 AM   Modules accepted: Orders

## 2021-06-05 ENCOUNTER — Telehealth: Payer: Self-pay | Admitting: Physician Assistant

## 2021-06-05 NOTE — Telephone Encounter (Signed)
Lennon Alstrom, PA-C  06/04/2021 11:09 PM EDT      Abnormal echo with EF or pump function 40-45%, reduced from 60-65%. LV global hypokinesis also now noted, which means the walls of his bottom left chamberare not moving as well as they were  01/2020.   Please make the pt an appointment for follow-up to discuss his echo &recommendations for workup in more detail. No appointment yet scheduled.

## 2021-06-05 NOTE — Telephone Encounter (Signed)
Wyn Forster, PA there are no APP appointments until early August. I did hold a first available DOD slot with Dr. Mariah Milling on 8/5.  Per Wayne, PA she preferred the patient not wait that long to be seen.  She advised a Wednesday hospital day would probably be best to add on in the clinic in the afternoon.   I advised I could offer the patient tomorrow.  I called and spoke with the patient regarding his echo results and Jacquelyn, PA's recommendations for an office visit. He is aware we could see him tomorrow early afternoon, but this would be a work in appointment with Harvel Quale coming over from the hospital. He advised he needed as late as possible. Advised the latest I could offer would be 2 pm.  The patient voices understanding and is agreeable with an appointment tomorrow 7/6 at 2:00pm.  Secure chat sent to scheduling to please add on as I cannot override Jacquelyn's blocked schedule.  Notified Jacquelyn and Pam, RN (to cover).

## 2021-06-06 ENCOUNTER — Encounter: Payer: Self-pay | Admitting: Physician Assistant

## 2021-06-06 ENCOUNTER — Other Ambulatory Visit: Payer: Self-pay

## 2021-06-06 ENCOUNTER — Ambulatory Visit (INDEPENDENT_AMBULATORY_CARE_PROVIDER_SITE_OTHER): Payer: 59 | Admitting: Physician Assistant

## 2021-06-06 VITALS — BP 106/70 | HR 63 | Ht 67.0 in | Wt 156.5 lb

## 2021-06-06 DIAGNOSIS — R002 Palpitations: Secondary | ICD-10-CM

## 2021-06-06 DIAGNOSIS — K8020 Calculus of gallbladder without cholecystitis without obstruction: Secondary | ICD-10-CM

## 2021-06-06 DIAGNOSIS — R0602 Shortness of breath: Secondary | ICD-10-CM

## 2021-06-06 DIAGNOSIS — R072 Precordial pain: Secondary | ICD-10-CM | POA: Diagnosis not present

## 2021-06-06 DIAGNOSIS — I5020 Unspecified systolic (congestive) heart failure: Secondary | ICD-10-CM

## 2021-06-06 DIAGNOSIS — Z87891 Personal history of nicotine dependence: Secondary | ICD-10-CM

## 2021-06-06 DIAGNOSIS — I77810 Thoracic aortic ectasia: Secondary | ICD-10-CM

## 2021-06-06 DIAGNOSIS — R011 Cardiac murmur, unspecified: Secondary | ICD-10-CM

## 2021-06-06 DIAGNOSIS — R7989 Other specified abnormal findings of blood chemistry: Secondary | ICD-10-CM

## 2021-06-06 DIAGNOSIS — Q205 Discordant atrioventricular connection: Secondary | ICD-10-CM | POA: Diagnosis not present

## 2021-06-06 DIAGNOSIS — Z87898 Personal history of other specified conditions: Secondary | ICD-10-CM

## 2021-06-06 DIAGNOSIS — Z8639 Personal history of other endocrine, nutritional and metabolic disease: Secondary | ICD-10-CM

## 2021-06-06 MED ORDER — IVABRADINE HCL 5 MG PO TABS: 10.0000 mg | ORAL_TABLET | ORAL | 0 refills | Status: DC

## 2021-06-06 NOTE — Patient Instructions (Addendum)
Medication Instructions:  No changes  *If you need a refill on your cardiac medications before your next appointment, please call your pharmacy*   Lab Work: TSH, FT4, BMET  If you have labs (blood work) drawn today and your tests are completely normal, you will receive your results only by: MyChart Message (if you have MyChart) OR A paper copy in the mail If you have any lab test that is abnormal or we need to change your treatment, we will call you to review the results.   Testing/Procedures: Coronary CT  Followed by: Luci Bank XT x 2 weeks to rule out arrhytmia   Your physician has recommended that you wear a Zio monitor for 14 days. Please call our office [phone] once you have done your testing so we can mail this monitor to you.    This monitor is a medical device that records the heart's electrical activity. Doctors most often use these monitors to diagnose arrhythmias. Arrhythmias are problems with the speed or rhythm of the heartbeat. The monitor is a small device applied to your chest. You can wear one while you do your normal daily activities. While wearing this monitor if you have any symptoms to push the button and record what you felt. Once you have worn this monitor for the period of time provider prescribed (Usually 14 days), you will return the monitor device in the postage paid box. Once it is returned they will download the data collected and provide [place] with a report which the provider will then review and we will call you with those results. Important tips:  Avoid showering during the first 24 hours of wearing the monitor. Avoid excessive sweating to help maximize wear time. Do not submerge the device, no hot tubs, and no swimming pools. Keep any lotions or oils away from the patch. After 24 hours you may shower with the patch on. Take brief showers with your back facing the shower head.  Do not remove patch once it has been placed because that will interrupt data and  decrease adhesive wear time. Push the button when you have any symptoms and write down what you were feeling. Once you have completed wearing your monitor, remove and place into box which has postage paid and place in your outgoing mailbox.  If for some reason you have misplaced your box then call our office and we can provide another box and/or mail it off for you.       Your cardiac CT will be scheduled at one of the below locations:   [facility] [address] [place], [place] [address] [phone]  OR  [facility] [address] [place], [place] [address] [phone]  If scheduled at [facility], please arrive at the [facility] main entrance (entrance A) of [facility] 30 minutes prior to test start time. Proceed to the [facility] Radiology Department (first floor) to check-in and test prep.  If scheduled at [facility], please arrive 15 mins early for check-in and test prep.  Please follow these instructions carefully (unless otherwise directed):  Hold all erectile dysfunction medications at least 3 days (72 hrs) prior to test.  On the Night Before the Test: Be sure to Drink plenty of water. Do not consume any caffeinated/decaffeinated beverages or chocolate 12 hours prior to your test. Do not take any antihistamines 12 hours prior to your test.   On the Day of the Test: Drink  plenty of water until 1 hour prior to the test. Do not eat any food 4 hours prior to the test. You may take your regular medications prior to the test.  Take Ivabradine (Corlanor) 5 mg take 2 tablets (10 mg) two hours prior to test.  After the Test: Drink plenty of water. After receiving IV contrast, you may experience a mild flushed feeling. This is normal. On occasion, you may experience a mild rash up to 24 hours after the test. This is not dangerous. If this occurs, you can take  Benadryl 25 mg and increase your fluid intake. If you experience trouble breathing, this can be serious. If it is severe call 911 IMMEDIATELY. If it is mild, please call our office. If you take any of these medications: Glipizide/Metformin, Avandament, Glucavance, please do not take 48 hours after completing test unless otherwise instructed.   Once we have confirmed authorization from your insurance company, we will call you to set up a date and time for your test. Based on how quickly your insurance processes prior authorizations requests, please allow up to 4 weeks to be contacted for scheduling your Cardiac CT appointment. Be advised that routine Cardiac CT appointments could be scheduled as many as 8 weeks after your provider has ordered it.  For non-scheduling related questions, please contact the cardiac imaging nurse navigator should you have any questions/concerns: Rockwell AlexandriaSara Wallace, Cardiac Imaging Nurse Navigator Larey BrickMerle Prescott, Cardiac Imaging Nurse Navigator [facility] Direct Office Dial: [phone]   For scheduling needs, including cancellations and rescheduling, please call [place], [phone].   Follow-Up: At [facility], you and your health needs are our priority.  As part of our continuing mission to provide you with exceptional heart care, we have created designated Provider Care Teams.  These Care Teams include your primary Cardiologist (physician) and Advanced Practice Providers (APPs -  Physician Assistants and Nurse Practitioners) who all work together to provide you with the care you need, when you need it.  We recommend signing up for the patient portal called "MyChart".  Sign up information is provided on this After Visit Summary.  MyChart is used to connect with patients for Virtual Visits (Telemedicine).  Patients are able to view lab/test results, encounter notes, upcoming appointments, etc.  Non-urgent messages can be sent to your  provider as well.   To learn more about what you can do with MyChart, go to [email or web address].    Your next appointment:   2 month(s)  The format for your next appointment:   In Person  Provider:   You may see Julien Nordmannimothy Gollan, MD or one of the following Advanced Practice Providers on your designated Care Team:   Nicolasa Duckinghristopher Berge, NP Eula Listenyan Dunn, PA-C Marisue IvanJacquelyn Visser, PA-C Cadence TroutmanFurth, [place] JerseyPA-C Gillian Shieldsaitlin Walker, NP   Other Instructions Let us know if any change in your symptoms    We recommend a maximum of 2g sodium per day and 2L total fluid per day. Fluids include coffee, tea, water, and juice.  In addition, we recommend you monitor both your daily weight and daily BP at the same time each day - bring this long into the office.    Heart-Healthy Eating Plan Heart-healthy meal planning includes: Eating less unhealthy fats. Eating more healthy fats. Making other changes in your diet. Talk with your doctor or a diet specialist (dietitian) to create an eating plan that is right for you. What is my plan? Your doctor may recommend an eating plan that includes:  Total fat: ______% or less of total calories a day. Saturated fat: ______% or less of total calories a day. Cholesterol: less than _________mg a day. What are tips for following this plan? Cooking Avoid frying your food. Try to bake, boil, grill, or broil it instead. You can also reduce fat by: Removing the skin from poultry. Removing all visible fats from meats. Steaming vegetables in water or broth. Meal planning  At meals, divide your plate into four equal parts: Fill one-half of your plate with vegetables and green salads. Fill one-fourth of your plate with whole grains. Fill one-fourth of your plate with lean protein foods. Eat 4-5 servings of vegetables per day. A serving of vegetables is: 1 cup of raw or cooked vegetables. 2 cups of raw leafy greens. Eat 4-5 servings of fruit per day. A serving of fruit  is: 1 medium whole fruit.  cup of dried fruit.  cup of fresh, frozen, or canned fruit.  cup of 100% fruit juice. Eat more foods that have soluble fiber. These are apples, broccoli, carrots, beans, peas, and barley. Try to get 20-30 g of fiber per day. Eat 4-5 servings of nuts, legumes, and seeds per week: 1 serving of dried beans or legumes equals  cup after being cooked. 1 serving of nuts is  cup. 1 serving of seeds equals 1 tablespoon.  General information Eat more home-cooked food. Eat less restaurant, buffet, and fast food. Limit or avoid alcohol. Limit foods that are high in starch and sugar. Avoid fried foods. Lose weight if you are overweight. Keep track of how much salt (sodium) you eat. This is important if you have high blood pressure. Ask your doctor to tell you more about this. Try to add vegetarian meals each week. Fats Choose healthy fats. These include olive oil and canola oil, flaxseeds, walnuts, almonds, and seeds. Eat more omega-3 fats. These include salmon, mackerel, sardines, tuna, flaxseed oil, and ground flaxseeds. Try to eat fish at least 2 times each week. Check food labels. Avoid foods with trans fats or high amounts of saturated fat. Limit saturated fats. These are often found in animal products, such as meats, butter, and cream. These are also found in plant foods, such as palm oil, palm kernel oil, and coconut oil. Avoid foods with partially hydrogenated oils in them. These have trans fats. Examples are stick margarine, some tub margarines, cookies, crackers, and other baked goods. What foods can I eat? Fruits All fresh, canned (in natural juice), or frozen fruits. Vegetables Fresh or frozen vegetables (raw, steamed, roasted, or grilled). Green salads. Grains Most grains. Choose whole wheat and whole grains most of the time. Rice andpasta, including brown rice and pastas made with whole wheat. Meats and other proteins Lean, well-trimmed beef, veal,  pork, and lamb. Chicken and [place] without skin. All fish and shellfish. Wild duck, rabbit, pheasant, and venison. Egg whites or low-cholesterol egg substitutes. Dried beans, peas, lentils, and tofu. Seedsand most nuts. Dairy Low-fat or nonfat cheeses, including ricotta and mozzarella. Skim or 1% milk that is liquid, powdered, or evaporated. Buttermilk that is made with low-fatmilk. Nonfat or low-fat yogurt. Fats and oils Non-hydrogenated (trans-free) margarines. Vegetable oils, including soybean, sesame, sunflower, olive, peanut, safflower, corn, canola, and cottonseed. Salad dressings or mayonnaisemade with a vegetable oil. Beverages Mineral water. Coffee and tea. Diet carbonated beverages. Sweets and desserts Sherbet, gelatin, and fruit ice. Small amounts of dark chocolate. Limit all sweets and desserts. Seasonings and condiments All seasonings and condiments. The items  listed above may not be a complete list of foods and drinks you can eat. Contact a dietitian for more options. What foods should I avoid? Fruits Canned fruit in heavy syrup. Fruit in cream or butter sauce. Fried fruit. Limitcoconut. Vegetables Vegetables cooked in cheese, cream, or butter sauce. Fried vegetables. Grains Breads that are made with saturated or trans fats, oils, or whole milk. Croissants. Sweet rolls. Donuts. High-fat crackers,such as cheese crackers. Meats and other proteins Fatty meats, such as hot dogs, ribs, sausage, bacon, rib-eye roast or steak. High-fat deli meats, such as salami and bologna. Caviar. Domestic duck andgoose. Organ meats, such as liver. Dairy Cream, sour cream, cream cheese, and creamed cottage cheese. Whole-milk cheeses. Whole or 2% milk that is liquid, evaporated, or condensed. Whole buttermilk. Cream sauce or high-fat cheese sauce. Yogurt that is made fromwhole milk. Fats and oils Meat fat, or shortening. Cocoa butter, hydrogenated oils, palm oil, coconut oil, palm kernel oil. Solid  fats and shortenings, including bacon fat, salt pork, lard, and butter. Nondairy cream substitutes. Salad dressings with cheeseor sour cream. Beverages Regular sodas and juice drinks with added sugar. Sweets and desserts Frosting. Pudding. Cookies. Cakes. Pies. Milk chocolate or white chocolate.Buttered syrups. Full-fat ice cream or ice cream drinks. The items listed above may not be a complete list of foods and drinks to avoid. Contact a dietitian for more information. Summary Heart-healthy meal planning includes eating less unhealthy fats, eating more healthy fats, and making other changes in your diet. Eat a balanced diet. This includes fruits and vegetables, low-fat or nonfat dairy, lean protein, nuts and legumes, whole grains, and heart-healthy oils and fats. This information is not intended to replace advice given to you by your health care provider. Make sure you discuss any questions you have with your healthcare provider. Document Revised: 01/22/2018 Document Reviewed: 12/26/2017 Elsevier Patient Education  2022 [employer].   [email or web address]">   DASH Eating Plan DASH stands for Dietary Approaches to Stop Hypertension. The DASH eating plan is a healthy eating plan that has been shown to: Reduce high blood pressure (hypertension). Reduce your risk for type 2 diabetes, heart disease, and stroke. Help with weight loss. What are tips for following this plan? Reading food labels Check food labels for the amount of salt (sodium) per serving. Choose foods with less than 5 percent of the Daily Value of sodium. Generally, foods with less than 300 milligrams (mg) of sodium per serving fit into this eating plan. To find whole grains, look for the word "whole" as the first word in the ingredient list. Shopping Buy products labeled as "low-sodium" or "no salt added." Buy fresh foods. Avoid canned foods and pre-made or frozen  meals. Cooking Avoid adding salt when cooking. Use salt-free seasonings or herbs instead of table salt or sea salt. Check with your health care provider or pharmacist before using salt substitutes. Do not fry foods. Cook foods using healthy methods such as baking, boiling, grilling, roasting, and broiling instead. Cook with heart-healthy oils, such as olive, canola, avocado, soybean, or sunflower oil. Meal planning  Eat a balanced diet that includes: 4 or more servings of fruits and 4 or more servings of vegetables each day. Try to fill one-half of your plate with fruits and vegetables. 6-8 servings of whole grains each day. Less than 6 oz (170 g) of lean meat, poultry, or fish each day. A 3-oz (85-g) serving of meat is about the same size as a deck of cards. One egg equals 1  oz (28 g). 2-3 servings of low-fat dairy each day. One serving is 1 cup (237 mL). 1 serving of nuts, seeds, or beans 5 times each week. 2-3 servings of heart-healthy fats. Healthy fats called omega-3 fatty acids are found in foods such as walnuts, flaxseeds, fortified milks, and eggs. These fats are also found in cold-water fish, such as sardines, salmon, and mackerel. Limit how much you eat of: Canned or prepackaged foods. Food that is high in trans fat, such as some fried foods. Food that is high in saturated fat, such as fatty meat. Desserts and other sweets, sugary drinks, and other foods with added sugar. Full-fat dairy products. Do not salt foods before eating. Do not eat more than 4 egg yolks a week. Try to eat at least 2 vegetarian meals a week. Eat more home-cooked food and less restaurant, buffet, and fast food.  Lifestyle When eating at a restaurant, ask that your food be prepared with less salt or no salt, if possible. If you drink alcohol: Limit how much you use to: 0-1 drink a day for women who are not pregnant. 0-2 drinks a day for men. Be aware of how much alcohol is in your drink. In the [place] one  drink equals one 12 oz bottle of beer (355 mL), one 5 oz glass of wine (148 mL), or one 1 oz glass of hard liquor (44 mL). General information Avoid eating more than 2,300 mg of salt a day. If you have hypertension, you may need to reduce your sodium intake to 1,500 mg a day. Work with your health care provider to maintain a healthy body weight or to lose weight. Ask what an ideal weight is for you. Get at least 30 minutes of exercise that causes your heart to beat faster (aerobic exercise) most days of the week. Activities may include walking, swimming, or biking. Work with your health care provider or dietitian to adjust your eating plan to your individual calorie needs. What foods should I eat? Fruits All fresh, dried, or frozen fruit. Canned fruit in natural juice (without addedsugar). Vegetables Fresh or frozen vegetables (raw, steamed, roasted, or grilled). Low-sodium or reduced-sodium tomato and vegetable juice. Low-sodium or reduced-sodium tomatosauce and tomato paste. Low-sodium or reduced-sodium canned vegetables. Grains Whole-grain or whole-wheat bread. Whole-grain or whole-wheat pasta. Brown rice. Orpah Cobb. Bulgur. Whole-grain and low-sodium cereals. Pita bread.Low-fat, low-sodium crackers. Whole-wheat flour tortillas. Meats and other proteins Skinless chicken or [place]. Ground chicken or [place]. Pork with fat trimmed off. Fish and seafood. Egg whites. Dried beans, peas, or lentils. Unsalted nuts, nut butters, and seeds. Unsalted canned beans. Lean cuts of beef with fat trimmed off. Low-sodium, lean precooked or cured meat, such as sausages or meatloaves. Dairy Low-fat (1%) or fat-free (skim) milk. Reduced-fat, low-fat, or fat-free cheeses. Nonfat, low-sodium ricotta or cottage cheese. Low-fat or nonfatyogurt. Low-fat, low-sodium cheese. Fats and oils Soft margarine without trans fats. Vegetable oil. Reduced-fat, low-fat, or light mayonnaise and salad dressings (reduced-sodium).  Canola, safflower, olive, avocado, soybean, andsunflower oils. Avocado. Seasonings and condiments Herbs. Spices. Seasoning mixes without salt. Other foods Unsalted popcorn and pretzels. Fat-free sweets. The items listed above may not be a complete list of foods and beverages you can eat. Contact a dietitian for more information. What foods should I avoid? Fruits Canned fruit in a light or heavy syrup. Fried fruit. Fruit in cream or buttersauce. Vegetables Creamed or fried vegetables. Vegetables in a cheese sauce. Regular canned vegetables (not low-sodium or reduced-sodium). Regular canned tomato  sauce and paste (not low-sodium or reduced-sodium). Regular tomato and vegetable juice(not low-sodium or reduced-sodium). Rosita Fire. Olives. Grains Baked goods made with fat, such as croissants, muffins, or some breads. Drypasta or rice meal packs. Meats and other proteins Fatty cuts of meat. Ribs. Fried meat. Tomasa Blase. Bologna, salami, and other precooked or cured meats, such as sausages or meat loaves. Fat from the back of a pig (fatback). Bratwurst. Salted nuts and seeds. Canned beans with added salt. Canned orsmoked fish. Whole eggs or egg yolks. Chicken or [place] with skin. Dairy Whole or 2% milk, cream, and half-and-half. Whole or full-fat cream cheese. Whole-fat or sweetened yogurt. Full-fat cheese. Nondairy creamers. Whippedtoppings. Processed cheese and cheese spreads. Fats and oils Butter. Stick margarine. Lard. Shortening. Ghee. Bacon fat. Tropical oils, suchas coconut, palm kernel, or palm oil. Seasonings and condiments Onion salt, garlic salt, seasoned salt, table salt, and sea salt. Worcestershire sauce. Tartar sauce. Barbecue sauce. Teriyaki sauce. Soy sauce, including reduced-sodium. Steak sauce. Canned and packaged gravies. Fish sauce. Oyster sauce. Cocktail sauce. Store-bought horseradish. Ketchup. Mustard. Meat flavorings and tenderizers. Bouillon cubes. Hot sauces. Pre-made or packaged  marinades. Pre-made or packaged taco seasonings. Relishes. Regular saladdressings. Other foods Salted popcorn and pretzels. The items listed above may not be a complete list of foods and beverages you should avoid. Contact a dietitian for more information. Where to find more information National Heart, Lung, and Blood Institute: PopSteam.is American Heart Association: www.heart.org Academy of Nutrition and Dietetics: www.eatright.org National Kidney Foundation: www.kidney.org Summary The DASH eating plan is a healthy eating plan that has been shown to reduce high blood pressure (hypertension). It may also reduce your risk for type 2 diabetes, heart disease, and stroke. When on the DASH eating plan, aim to eat more fresh fruits and vegetables, whole grains, lean proteins, low-fat dairy, and heart-healthy fats. With the DASH eating plan, you should limit salt (sodium) intake to 2,300 mg a day. If you have hypertension, you may need to reduce your sodium intake to 1,500 mg a day. Work with your health care provider or dietitian to adjust your eating plan to your individual calorie needs. This information is not intended to replace advice given to you by your health care provider. Make sure you discuss any questions you have with your healthcare provider. Document Revised: 10/22/2019 Document Reviewed: 10/22/2019 Elsevier Patient Education  2022 [employer].

## 2021-06-06 NOTE — Progress Notes (Signed)
Office Visit    Patient Name: Caroleen HammanJared A Abboud Date of Encounter: 06/06/2021  PCP:  Eustaquio BoydenGutierrez, Javier, MD   [facility]  Cardiologist:  Julien Nordmannimothy Gollan, MD  Advanced Practice Provider:  No care team member to display Electrophysiologist:  None   Chief Complaint    Chief Complaint  Patient presents with   Other    Discuss echo results no complaints today. Meds reviewed verbally with pt.    30 y.o. 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 Elizebeth Brookingotton, no need for SBE ppx, 05/2011 last check WNL, rtc 2 yrs   Past Surgical History:  Procedure Laterality Date   holter  06/2011   rare PAC, PVC   scrotal us  01/2012   1.3cm epididymal cyst on left, o/w normal   TRANSPOSITION OF GREAT VESSELS REPAIR     infant   US ECHOCARDIOGRAPHY  05/2011   normal bi vent size/fxn, normal flow across all valves, subsystemic R vent pressure, patent aortic arch, no pericardial effusion    Allergies  No Known Allergies  History of Present Illness    Caroleen HammanJared A Faeth is a 30 y.o. male with PMH as above.   Remote cardiac  monitoring showed rare PACs and PVCs.  2017 echo EF 60 to 65%, NRWMA, mildly dilated aortic root measuring 3.9 cm, mild MR.  2017 CTA of the aorta showed mild ectasia of the ascending aorta 3.8 cm along with repair of transposition of the great vessels.    2018 echo EF 55 to 60%, mildly dilated aortic root of 3.8 cm, mildly dilated ascending aorta, mildly dilated RV with mildly reduced RVSF.    Seen in office 2019 and doing well without significant chest pain.  He was seen in the ED 01/2020 with 4-day history of left-sided chest tightness that radiated to his back with occasional shortness of breath.  Symptoms correlated with food consumption and improved with nebulizer therapy.  He was discharged with albuterol.    Seen 01/2020 and wondered if he was sensitive to caffeine.  He had used his albuterol once.  He continued to note occasional left-sided chest pressure and burning that radiated to the left upper shoulder.  Symptoms typically lasted 10 to 15 minutes and were associated with some shortness of breath.  He was seen by his PCP and prescribed hydroxyzine for possible anxiety.  He was last seen 03/20/2020 by his primary cardiologist.  At that time, he wondered if his symptoms were due to mold.  He was seen by pulmonary with Aspergillus antibody test.  He reported coughing up green mucus 3 to 4 weeks prior, which was improving.  Recent echo  and CT of the chest reviewed.  Cardiac CT also reviewed with CADRADS score of 0 and coronary artery calcium score of 0.   Seen 04/05/2021 and doing well from a cardiac standpoint.  He reported recent ultrasound with a cyst on his kidneys, for which he was undergoing work-up. No chest pain or right shoulder pain as reported in the past.  Since that time, cholelithiasis was identified through his scans.  No racing heart rate.  He did report palpitations from time to time, especially when working out.  He noted a history of higher potassium due to eating high potassium  foods and root vegetables.  Echo ordered, as well as CTA of the chest and aorta and as per annual monitoring.  Subsequent Echo showed EF reduced to 40-45%, LV global hypokinesis, mild RVE, mild RAE, RAP  Today, 06/06/2021, he returns to clinic to review his recent echo, given his EF has reduced from that of previous echoes with LV global hypokinesis and mildly enlarged right heart chamber sizes.  He denies any current chest pain or shortness of breath.  He does report a previous history of chest pain that has been chronic and ongoing for some time.  He is unable to describe the chest pain well, stating that it feels associated with food.  He reports feeling gassy at times after eating, though attributes this to a high-fiber diet.  He describes it as a dull heaviness that is constant and can last for days.  He sometimes has pain in his left shoulder blade.  He reports that he is still able to work out without any shortness of breath, unless overexerting himself.  He works out on his bicycle and reports that his exercise tolerance is stable.  He denies any lower extremity edema orthopnea.  At times, he does note needing to take a deeper breath or several deeper breaths while seated.  He does report some palpitations but stable/unchanged from previous visits.  No presyncope or syncope.  At times, he does wake up in the middle of the night.  On further questioning, he thinks he wakes 2/2 tachypalpitations.  He reports days during which he feels unrested or more fatigued than others.  STOP-BANG assessment performed as below with patient only scoring 2.  He denies any alcohol use and does not drink caffeine.  No tobacco use since 7 to 8 years ago.  He denies any drug use.  He does report that he is under a lot of stress and currently a caretaker himself.   *After visit completion: Patient was completing blood work when felt diaphoretic and sx of presyncope, most consistent with vasovagal etiology. Recommendation  was for him to remain in clinic for observation until feeling better. Water given to patient with improvement in sx. EKGs as below without acute changes from prior and reviewed by DOD.   Home Medications   Current Outpatient Medications  Medication Instructions   Cholecalciferol (VITAMIN D) 50 MCG (2000 UT) tablet 1 tablet   vitamin B-12 (CYANOCOBALAMIN) 1000 MCG tablet Daily PRN     Review of Systems    He denies current chest pain but reports a history of vague chest pain and left shoulder pain as described above in HPI.  He has occasional palpitations, most noticeable when working out, and unchanged from previous clinic visits.  No dyspnea unless overexerting himself.  No pnd, orthopnea, n, v, dizziness (prior to the vasovagal event experienced after clinic as described above), syncope, edema, weight gain, or  early satiety.  He reports occasional gas and feeling unrested/ fatigue.  All other systems reviewed and are otherwise negative except as noted above.  Physical Exam    VS:  BP 106/70 (BP Location: Left Arm, Patient Position: Sitting, Cuff Size: Normal)   Pulse 63   Ht 5\' 7"  (1.702 m)   Wt 156 lb 8 oz (71 kg)   SpO2 99%   BMI 24.51 kg/m  , BMI Body mass index is 24.51 kg/m. GEN: Well nourished, well developed, in no acute distress. HEENT: normal. Neck: Supple, no JVD, no carotid bruits with radiation of holosystolic murmur into the bilateral carotids. No masses. Cardiac: RRR,III/VI holosystolic murmur with radiation into the carotids. No rubs or gallops. No clubbing, cyanosis, edema.  Radials/DP/PT 2+ and equal bilaterally.  Respiratory:  Respirations regular and unlabored, clear to auscultation bilaterally. GI: Soft, nontender, nondistended, BS + x 4. MS: no deformity or atrophy. Skin: warm and dry, no rash. Neuro:  Strength and sensation are intact. Psych: Normal affect.  Accessory Clinical Findings    ECG personally reviewed by me today -no EKG taken prior to  visit  EKG taken after completion of his visit as above in HPI and due to dizziness and diaphoresis at the time of lab collection.  15:34:13 EKG showed sinus bradycardia, 47 bpm, RAD, incomplete RBBB (known), IVCD/LVH with repolarization abnormalities-reviewed by myself and DOD and without acute changes from prior.  15:38:03 EKG and rhythm strip showed sinus bradycardia, 45 bpm, RAD, incomplete RBBB (known), IVCD/LVH with repolarization abnormalities, no acute changes from previous, and reviewed by DOD.  15:45:25 EKG showed sinus bradycardia with sinus arrhythmia, 52 bpm, RAD, incomplete RBBB (known), IVCD/LVH with repolarization abnormalities, poor lead placement in V2, baseline wander, no acute changes from previous  VITALS Reviewed today   Temp Readings from Last 3 Encounters:  03/05/21 98.2 F (36.8 C) (Temporal)  06/08/20 97.8 F (36.6 C) (Temporal)  04/13/20 98 F (36.7 C) (Temporal)   BP Readings from Last 3 Encounters:  06/06/21 106/70  04/05/21 108/68  03/05/21 112/66   Pulse Readings from Last 3 Encounters:  06/06/21 63  04/05/21 71  03/05/21 72    Wt Readings from Last 3 Encounters:  06/06/21 156 lb 8 oz (71 kg)  04/05/21 153 lb (69.4 kg)  03/05/21 153 lb 9 oz (69.7 kg)     LABS  reviewed today   Lab Results  Component Value Date   WBC 5.1 08/24/2020   HGB 15.4 08/24/2020   HCT 45.2 08/24/2020   MCV 88.8 08/24/2020   PLT 152.0 08/24/2020   Lab Results  Component Value Date   CREATININE 0.89 04/05/2021   BUN 12 04/05/2021   NA 141 04/05/2021   K 4.8 04/05/2021   CL 99 04/05/2021   CO2 26 04/05/2021   Lab Results  Component Value Date   ALT 22 08/24/2020   AST 23 08/24/2020   ALKPHOS 84 08/24/2020   BILITOT 0.6 08/24/2020   Lab Results  Component Value Date   CHOL 84 02/21/2021   HDL 34.80 (L) 02/21/2021   LDLCALC 40 02/21/2021   TRIG 46.0 02/21/2021   CHOLHDL 2 02/21/2021    No results found for: HGBA1C Lab Results  Component Value Date    TSH 3.41 02/21/2021     STUDIES/PROCEDURES reviewed today    STOP-BANG Rex assessment S-snore  Have you been told that you snore?   0  T-tired Are you often tired, fatigued, sleepy during the day?  1  O-obstruction Do you stop breathing, choke, or gasp during sleep?   0  P-pressure Do you have or are you being treated for high blood pressure?  0  B- BMI Is your BMI greater than 35 kg/m?  0  A- age Are you 50 years or older?  0  N-neck Do you have a neck circumference greater than 16 inches?  0  G-gender Are you male?   1  Total  2   Echo 05/31/21  1. Left ventricular ejection fraction, by estimation, is 40 to 45%. The  left ventricle has mild to moderately decreased function. The left  ventricle demonstrates global hypokinesis. Left ventricular diastolic  parameters were normal. The average left  ventricular global longitudinal strain is -16.4 %. The global longitudinal  strain is normal.   2. Right ventricular systolic function is normal. The right ventricular  size is mildly enlarged.   3. Right atrial size was mildly dilated.   4. The mitral valve is normal in structure. No evidence of mitral valve  regurgitation.   5. The aortic valve was not well visualized. Aortic valve regurgitation  is not visualized.   6. The inferior vena cava is normal in size with <50% respiratory  variability, suggesting right atrial pressure of 8 mmHg.   CTA  03/07/20 IMPRESSION: 1. 4.1 cm ascending aortic aneurysm without complicating features. Recommend annual imaging followup by CTA or MRA. This recommendation follows 2010 ACCF/AHA/AATS/ACR/ASA/SCA/SCAI/SIR/STS/SVM Guidelines for the Diagnosis and Management of Patients with Thoracic Aortic Disease. Circulation. 2010; 121: T903-E092. Aortic aneurysm NOS (ICD10-I71.9) 2. 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 3. Cholelithiasis.  Coronary  CT 03/06/20 IMPRESSION: 1. No evidence of CAD, CADRADS = 0. 2. Coronary calcium score of 0. This was 0 percentile for age and sex matched control. 3. 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 4. Small anteriorly displaced LAD artery which runs down the anterior wall. 5. Surgically corrected d-Transposition (arterial switch) with anteriorly displaced pulmonary artery. 6. Dilated sinus of valsalva at 38 mm and aortic root to mid-ascending aorta to 4.0 cm.   Echo 01/2020  1. Left ventricular ejection fraction, by estimation, is 60 to 65%. The  left ventricle has normal function. The left ventricle has no regional  wall motion abnormalities. Left ventricular diastolic parameters were  normal. The average left ventricular  global longitudinal strain is -21.8 %.   2. Right ventricular systolic function is mildly reduced. The right  ventricular size is mildly enlarged. There is normal pulmonary artery  systolic pressure.   3. The mitral valve is normal in structure and function. No evidence of  mitral valve regurgitation. No evidence of mitral stenosis.   4. The aortic valve is normal in structure and function. Aortic valve  regurgitation is not visualized. No aortic stenosis is present.   5. Ascending aorta is not visualized.. There is dilatation of the aortic  root measuring 41 mm.   6. The inferior vena cava is normal in size with greater than 50%  respiratory variability, suggesting right atrial pressure of 3 mmHg. Comparison(s): 07/23/17 EF 55-60%.   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 History of vague / atypical CP, unchanged from previous visits as above, and attributed to diet and  GI issues. Main RF for CAD is prior tobacco use; however, lower suspicion for  cor insufficiency due to CAD given age / lack of RF with 03/2020 CAC score 0. Considered is possible stenosis due to anatomy - previous coronary CTA shows anomalous takeoff of the LCx from the RCA and anterior takeoff of a diminutive LAD between the anterior PA and aortic annulus. EKG today without acute ST/T changes, discussed with DOD.  Bradycardia attributed to suspected vasovagal event during blood collection and as detailed in HPI above. I reviewed pt's 05/2021 echo, which shows reduced EF 40-45%, LV global hypokinesis, and mild dilation of right heart chambers. We discussed possible etiologies of reduced EF and to include NICM/ICM. Case discussed with primary cardiologist, and will obtain cCTA for repeat assessment of repair / arterial switch, as well as reassessment of coronary arteries. This will also likely give [place] better visualization of his AV given the echo was not able to visualize this valve well as under CV studies above. Further recommendations pending cCTA and to include Zio XT x2 weeks as below to rule out tachycardia induced CM after that time. Discussed medical therapy options with pt understanding. For now, continue RF modification. Current LDL well controlled. He will continue a heart healthy and low salt diet, as well as ongoing activity as discussed.   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 As per report, dilation thought to most likely represent post operative change, rather than true aneurysmal dilation. By recent echo, the aortic valve and aortic root not well visualized. AV mean gradient 2.53mmHg, peak gradient 4.81mmHg, and area by VTI 4.57cm2. Avoid FQ, heavy lifting. HR, BP, LDL control recommended and with all well controlled. Continue annual monitoring.  Holosystolic murmur/transposition of the great vessels -- S/p surgical repair with  d-Transposition arterial switch as an infant. Holosystolic III/VI murmur appreciated throughout the exam with radiation to the bilateral carotids. Echo without significant valvular abnormalities and with AV measurements as above. cCTA ordered today for further assessment of anatomy and better visualization of his AV as above.  Palpitations, ongoing --Chronic palpitations reported and particularly noticed during workouts. EKG without ectopy today. Will plan for Zio XT x2 weeks for further workup and to rule out tachycardia induced CM, given reduced EF as above. We discussed his [employer] moitor will be placed after his cCTA. Check BMET given history of hyperkalemia. Check TSH, FT4 to make sure wnl. Will also check B12 per pt request.    Vasovagal event, suspected as 2/2 lab collection --As above in HPI. Diaphoresis and presyncope in the setting of lab collection. He felt significantly improved after this event with several minutes of rest rest and water. EKGs during that time without acute changes as showing bradycardia, consistent with vasovagal etiology, and with rate improving with time passed after the event. Advised hydration and slow position changes today.  Renal cyst / Cholelithiasis --Per GI/surgery.   Medication changes: None Labs ordered: BMET, TSH, FT4, B12 per pt request Studies / Imaging ordered: cCTA, followed by Zio XT x2 weeks Disposition: RTC after studies  *Please be aware that the above documentation was completed voice recognition software and may contain dictation errors.      Lennon Alstrom, PA-C 06/06/2021

## 2021-06-07 LAB — TSH: TSH: 2.51 u[IU]/mL (ref 0.450–4.500)

## 2021-06-07 LAB — BASIC METABOLIC PANEL
BUN/Creatinine Ratio: 15 (ref 9–20)
BUN: 15 mg/dL (ref 6–20)
CO2: 25 mmol/L (ref 20–29)
Calcium: 9.7 mg/dL (ref 8.7–10.2)
Chloride: 99 mmol/L (ref 96–106)
Creatinine, Ser: 0.99 mg/dL (ref 0.76–1.27)
Glucose: 96 mg/dL (ref 65–99)
Potassium: 5 mmol/L (ref 3.5–5.2)
Sodium: 139 mmol/L (ref 134–144)
eGFR: 107 mL/min/{1.73_m2} (ref >59)

## 2021-06-07 LAB — VITAMIN B12: Vitamin B-12: 673 pg/mL (ref 232–1245)

## 2021-06-07 LAB — T4, FREE: Free T4: 1.57 ng/dL (ref 0.82–1.77)

## 2021-06-07 NOTE — Addendum Note (Signed)
Addended by: Theola Sequin on: 06/07/2021 03:16 PM   Modules accepted: Orders

## 2021-06-08 ENCOUNTER — Telehealth: Payer: Self-pay | Admitting: *Deleted

## 2021-06-08 NOTE — Telephone Encounter (Signed)
Error

## 2021-06-13 ENCOUNTER — Encounter: Payer: Self-pay | Admitting: Gastroenterology

## 2021-06-20 ENCOUNTER — Telehealth (HOSPITAL_COMMUNITY): Payer: Self-pay | Admitting: Emergency Medicine

## 2021-06-20 ENCOUNTER — Other Ambulatory Visit (HOSPITAL_COMMUNITY): Payer: Self-pay | Admitting: Emergency Medicine

## 2021-06-20 DIAGNOSIS — I77810 Thoracic aortic ectasia: Secondary | ICD-10-CM

## 2021-06-20 NOTE — Progress Notes (Signed)
CTA chest aorta added to be done with CCTA to assess coronaries and aortic root dilatation  Rockwell Alexandria RN Navigator Cardiac Imaging [facility] [phone] Office  [phone] Cell

## 2021-06-20 NOTE — Telephone Encounter (Signed)
Reaching out to patient to offer assistance regarding upcoming cardiac imaging study; pt verbalizes understanding of appt date/time, parking situation and where to check in, pre-test NPO status and medications ordered, and verified current allergies; name and call back number provided for further questions should they arise Rockwell Alexandria RN Navigator Cardiac Imaging [facility] [phone] office [phone] cell  10mg  ivabradine Denies iv issues Denies claustro

## 2021-06-21 ENCOUNTER — Ambulatory Visit (HOSPITAL_COMMUNITY)
Admission: RE | Admit: 2021-06-21 | Discharge: 2021-06-21 | Disposition: A | Discharge status: Home / Self Care | Payer: 59 | Source: Ambulatory Visit | Attending: Physician Assistant | Admitting: Physician Assistant

## 2021-06-21 ENCOUNTER — Other Ambulatory Visit: Payer: Self-pay

## 2021-06-21 DIAGNOSIS — K8020 Calculus of gallbladder without cholecystitis without obstruction: Secondary | ICD-10-CM | POA: Diagnosis present

## 2021-06-21 DIAGNOSIS — Z87891 Personal history of nicotine dependence: Secondary | ICD-10-CM

## 2021-06-21 DIAGNOSIS — R7989 Other specified abnormal findings of blood chemistry: Secondary | ICD-10-CM

## 2021-06-21 DIAGNOSIS — Z8639 Personal history of other endocrine, nutritional and metabolic disease: Secondary | ICD-10-CM

## 2021-06-21 DIAGNOSIS — I5020 Unspecified systolic (congestive) heart failure: Secondary | ICD-10-CM

## 2021-06-21 DIAGNOSIS — Z87898 Personal history of other specified conditions: Secondary | ICD-10-CM | POA: Diagnosis present

## 2021-06-21 DIAGNOSIS — R002 Palpitations: Secondary | ICD-10-CM

## 2021-06-21 DIAGNOSIS — R072 Precordial pain: Secondary | ICD-10-CM

## 2021-06-21 DIAGNOSIS — R0602 Shortness of breath: Secondary | ICD-10-CM

## 2021-06-21 DIAGNOSIS — I77810 Thoracic aortic ectasia: Secondary | ICD-10-CM | POA: Diagnosis not present

## 2021-06-21 DIAGNOSIS — Q205 Discordant atrioventricular connection: Secondary | ICD-10-CM

## 2021-06-21 DIAGNOSIS — R011 Cardiac murmur, unspecified: Secondary | ICD-10-CM | POA: Diagnosis present

## 2021-06-21 MED ORDER — METOPROLOL TARTRATE 5 MG/5ML IV SOLN: 5.0000 mg | INTRAVENOUS | Status: DC | PRN

## 2021-06-21 MED ORDER — NITROGLYCERIN 0.4 MG SL SUBL
0.8000 mg | SUBLINGUAL_TABLET | Freq: Once | SUBLINGUAL | Status: AC
  Administered 2021-06-21: 0.8 mg via SUBLINGUAL

## 2021-06-21 MED ORDER — IOHEXOL 350 MG/ML SOLN
100.0000 mL | Freq: Once | INTRAVENOUS | Status: AC | PRN
  Administered 2021-06-21: 100 mL via INTRAVENOUS

## 2021-06-21 MED ORDER — NITROGLYCERIN 0.4 MG SL SUBL
SUBLINGUAL_TABLET | SUBLINGUAL | Status: AC
  Filled 2021-06-21: qty 2

## 2021-06-27 ENCOUNTER — Telehealth: Payer: Self-pay

## 2021-06-27 NOTE — Telephone Encounter (Signed)
After I left a detailed VM per DPR on file with the following result note:  Study relatively unchanged from prior.     Forwarding to primary cardiologist in the event of additional recommendations,given recent echo findings.  Patient called back with concerns about his testing and stated that Konrad Saha was going to be starting him on some medication to address his Heart Failure and Diabetes. Patient stated that he is feeling very stressed and is wanting to start medication management. I informed him that Dr. Mariah Milling has been sent results to review as stated in Jacquelyn's note. He is upset that he is not scheduled to see Dr. Mariah Milling until 08/08/21 and is worried about how far out that appointment is. I informed him that I would send this message to both JV and TG as we have no earlier appointments available, and would inform him of any recommendations they may have.  Patient was grateful for the information and awaits our response.

## 2021-07-02 NOTE — Telephone Encounter (Signed)
Was able to reach out to pt regarding his CTA results follow-up based on Dr. Windell Hummingbird review of testing and advised  "I think the cardiac CTA is actually very reassuring  Stable coronary anatomy  Normal ejection fraction estimated 67% (up from what the echo determined)  No significant blockages  Overall excellent study  We should express this to Mr. Thornsberry  TG "  Pt thankful for the phone call and reassuring update of his test, no need for sooner appt at this time, will keep schedule appt for 9/7, no new meds to address, pt advised to call back for any further or new cardiac symptoms. Pt verbalized understanding.

## 2021-08-02 ENCOUNTER — Encounter: Payer: Self-pay | Admitting: Gastroenterology

## 2021-08-02 ENCOUNTER — Ambulatory Visit: Payer: 59 | Admitting: Gastroenterology

## 2021-08-02 VITALS — BP 100/50 | HR 78 | Ht 67.0 in | Wt 150.0 lb

## 2021-08-02 DIAGNOSIS — R140 Abdominal distension (gaseous): Secondary | ICD-10-CM

## 2021-08-02 DIAGNOSIS — K8020 Calculus of gallbladder without cholecystitis without obstruction: Secondary | ICD-10-CM

## 2021-08-02 NOTE — Patient Instructions (Signed)
If you are age 28 or older, your body mass index should be between 23-30. Your Body mass index is 23.49 kg/m. If this is out of the aforementioned range listed, please consider follow up with your Primary Care Provider.  If you are age 60 or younger, your body mass index should be between 19-25. Your Body mass index is 23.49 kg/m. If this is out of the aformentioned range listed, please consider follow up with your Primary Care Provider.   Please start Beano as needed.  Start Align probiotic.  The  GI providers would like to encourage you to use [facility] to communicate with providers for non-urgent requests or questions.  Due to long hold times on the telephone, sending your provider a message by [facility] may be a faster and more efficient way to get a response.  Please allow 48 business hours for a response.  Please remember that this is for non-urgent requests.   It was a pleasure to see you today!  Thank you for trusting me with your gastrointestinal care!    Scott E. Tomasa Rand, MD

## 2021-08-02 NOTE — Progress Notes (Signed)
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 Eustaquio Boyden for right upper quadrant abdominal pain and presence of gallstones.  He was seen by a surgeon who recommended cholecystectomy but also recommended a GI evaluation. The patient states that he started having right upper quadrant pain about 6 months ago.  The pain is located underneath the right rib cage.  The pain is infrequent and he cannot even remember the last time he had it.  He did notice it more with high-fat foods.  It was not associated with nausea or vomiting.  He is not eager to get his gallbladder out if it does not absolutely need to come out. In addition to this recurrent right upper quadrant pain, the patient is also bothered by gas and bloating.  The symptoms are noticed more when he eats beans and cruciferous vegetables, both of which he eats very regularly.  He wonders whether he has a "mineral imbalance" and if I recommend digestive enzyme supplementation. He has regular bowel movements, and denies problems with constipation or diarrhea.  No blood in the stool.  He denies symptoms of heartburn or acid regurgitation.  No nausea or vomiting.  No dysphagia.  His weight has been stable.      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 Elizebeth Brooking, no need for SBE ppx, 05/2011 last check WNL, rtc 2 yrs     Past Surgical History:  Procedure Laterality Date   holter  06/2011   rare PAC, PVC   scrotal US  01/2012   1.3cm epididymal cyst on left, o/w normal   TRANSPOSITION OF GREAT VESSELS REPAIR     infant   US ECHOCARDIOGRAPHY  05/2011   normal bi vent size/fxn, normal flow across all  valves, subsystemic R vent pressure, patent aortic arch, no pericardial effusion   Family History  Problem Relation Age of Onset   Diabetes Maternal Grandmother    Coronary artery disease Maternal Uncle 60   Cancer Maternal Uncle        pancreatic cancer   Stroke Neg Hx    Social History   Tobacco Use   Smoking status: Former    Packs/day: 0.50    Years: 2.00    Pack years: 1.00    Types: Cigarettes    Quit date: 10/22/2014    Years since quitting: 6.7   Smokeless tobacco: Former    Types: Snuff  Substance Use Topics   Alcohol use: Yes    Alcohol/week: 0.0 standard drinks    Comment: Occasional   Drug use: No    Types: Marijuana    Comment: MJ-(trying to stop 12/07/14)   No current outpatient medications on file.   No current facility-administered medications for this visit.   No Known Allergies   Review of Systems: All systems reviewed and negative except where noted in HPI.    No results found.  Physical Exam: Ht 5\' 7"  (1.702 m)   Wt 150 lb (68 kg)   BMI 23.49 kg/m  Constitutional: Pleasant,well-developed, Caucasian male in no acute distress. HEENT: Normocephalic and atraumatic. Conjunctivae are normal.  No scleral icterus. Cardiovascular: Normal rate, regular rhythm.  Systolic murmur 4/6 Pulmonary/chest: Effort normal and breath sounds normal. No wheezing, rales or rhonchi. Abdominal: Soft, nondistended, nontender. Bowel sounds active throughout. There are no masses palpable. No hepatomegaly. Extremities: no edema Neurological: Alert and oriented to person place and time. Skin: Skin is warm and dry. No rashes noted. Psychiatric: Normal mood and affect. Behavior is normal.  CBC    Component Value Date/Time   WBC 5.1 08/24/2020 0846   RBC 5.09 08/24/2020 0846   HGB 15.4 08/24/2020 0846   HCT 45.2 08/24/2020 0846   PLT 152.0 08/24/2020 0846   MCV 88.8 08/24/2020 0846   MCH 29.3 01/30/2020 0112   MCHC 34.1 08/24/2020 0846   RDW 13.1 08/24/2020 0846    LYMPHSABS 1.4 08/24/2020 0846   MONOABS 0.6 08/24/2020 0846   EOSABS 0.1 08/24/2020 0846   BASOSABS 0.0 08/24/2020 0846    CMP     Component Value Date/Time   NA 139 06/06/2021 1517   K 5.0 06/06/2021 1517   CL 99 06/06/2021 1517   CO2 25 06/06/2021 1517   GLUCOSE 96 06/06/2021 1517   GLUCOSE 78 02/21/2021 0933   BUN 15 06/06/2021 1517   CREATININE 0.99 06/06/2021 1517   CALCIUM 9.7 06/06/2021 1517   PROT 7.1 08/24/2020 0846   ALBUMIN 4.9 08/24/2020 0846   AST 23 08/24/2020 0846   ALT 22 08/24/2020 0846   ALKPHOS 84 08/24/2020 0846   BILITOT 0.6 08/24/2020 0846   GFRNONAA >60 01/30/2020 0112   GFRAA >60 01/30/2020 0112     ASSESSMENT AND PLAN: 30 year old male with history of transposition of the great vessels that was surgically repaired as an infant, with recent history of infrequent right upper quadrant pain, noted to have gallstones.  It seems likely that his right upper quadrant pain is related to the stones, and I told him in general if the patient is thought to have any symptoms from gallstones it is best to undergo cholecystectomy.  The patient does not have any other symptoms that would localize to the upper GI tract does not have any symptoms suspicious for peptic ulcer disease.  I do not think an upper endoscopy is warranted.  Currently, the patient does not want to have surgery, but this conversation is best had between him and his surgeon.  His symptoms of bloating and gas with consumption of legumes and cruciferous vegetables is not uncommon and is not indicative of a disease process.  I told him the only reliable way to alleviate the symptoms is to eliminate those foods from his diet.  Or, if he chooses to continue to eat these foods, he could try taking an over-the-counter Beano as needed or try taking a probiotic daily.  I recommended he try the Beano first when he is eating any of these problem foods  Bloating - As needed Beano -Trial of Align probiotic if Beano  not helpful  Right upper quadrant pain/cholelithiasis - Symptoms very infrequent, no suspicion for peptic ulcer disease or other luminal pathology - Continue follow-up with surgeon regarding cholecystectomy  Tamanika Heiney E. Tomasa Rand, MD [facility]  Eustaquio Boyden, MD

## 2021-08-07 NOTE — Progress Notes (Signed)
Cardiology Office Note  Date:  08/08/2021   ID:  SHAUL TRAUTMAN, DOB [birth year], MRN [id number]  PCP:  Eustaquio Boyden, MD   Chief Complaint  Patient presents with   2 month follow up     "Doing well." Medications reviewed by the patient verbally.     HPI:  Mr. JOHNCARLO MAALOUF is a 30 y.o. 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 Left ventricular ejection fraction, by estimation, is 45%.   Cardiac CTA Normal left ventricular function, EF=67%. No significant change in structural heart disease  Weight down 20 pounds in 3 years Eating less  Cardiac CTA reviewed 1. S/p arterial switch with expected post-surgical changes. 2. 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 Coronary anomalies are unchanged. 3. Coronary calcium score of 0. No evidence of coronary artery disease. 4. Normal left ventricular size with normal function, EF 67%. 5. Trileaflet aortic valve with normal leaflet motion and no calcification. 6. Aortic root measures up to 39 mm. 7. Ascending aorta measures up to 40 mm at the STJ.  Unchanged. 8. Diminutive right and left pulmonary arteries with measurements above. No change from prior. 9. Overall, study is unchanged from  prior.  EKG personally reviewed by myself on todays visit NSR rate 64 bpm  no ST or T wave changes  Other medical hx Echocardiogram 2017 and 2018 reviewed with him in detail Mildly dilated aorta, stable Aortic root 3.9 cm, up from 3.5 cm in 2016 Normal cardiac function    PMH:   has a past medical history of Alcohol use, History of smoking, History of venomous snake bite, 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  PSH:    Past Surgical History:  Procedure Laterality Date   holter  06/2011   rare PAC, PVC   scrotal US  01/2012   1.3cm epididymal cyst on left, o/w normal   TRANSPOSITION OF GREAT VESSELS REPAIR     infant   US ECHOCARDIOGRAPHY  05/2011   normal bi vent size/fxn, normal flow across all valves, subsystemic R vent pressure, patent aortic arch, no pericardial effusion   No current outpatient medications on file prior to visit.   No current facility-administered medications on file prior to visit.    Allergies:   Patient has no known allergies.   Social History:  The patient  reports that he quit smoking about 6 years ago. His smoking use included cigarettes. He has a 1.00 pack-year smoking history. He has quit using smokeless tobacco.  His smokeless tobacco use included snuff. He reports current alcohol use. He reports that he does not use drugs.   Family History:   family history includes Cancer in his maternal uncle; Coronary artery disease (age of onset: 14) in his maternal  uncle; Diabetes in his maternal grandmother.   Review of Systems: Review of Systems  Constitutional: Negative.   HENT: Negative.    Respiratory: Negative.    Cardiovascular: Negative.   Gastrointestinal: Negative.   Musculoskeletal: Negative.   Neurological: Negative.   Psychiatric/Behavioral: Negative.    All other systems reviewed and are negative.  PHYSICAL EXAM: VS:  BP 104/60 (BP Location: Left Arm, Patient Position: Sitting, Cuff Size:  Normal)   Pulse 62   Ht 5\' 7"  (1.702 m)   Wt 151 lb 6 oz (68.7 kg)   SpO2 98%   BMI 23.71 kg/m  , BMI Body mass index is 23.71 kg/m. Constitutional:  oriented to person, place, and time. No distress.  HENT:  Head: Grossly normal Eyes:  no discharge. No scleral icterus.  Neck: No JVD, no carotid bruits  Cardiovascular: Regular rate and rhythm, 2-3/6 SEM LSB  Pulmonary/Chest: Clear to auscultation bilaterally, no wheezes or rails Abdominal: Soft.  no distension.  no tenderness.  Musculoskeletal: Normal range of motion Neurological:  normal muscle tone. Coordination normal. No atrophy Skin: Skin warm and dry Psychiatric: normal affect, pleasant  Recent Labs: 08/24/2020: ALT 22; Hemoglobin 15.4; Platelets 152.0 06/06/2021: BUN 15; Creatinine, Ser 0.99; Potassium 5.0; Sodium 139; TSH 2.510    Lipid Panel Lab Results  Component Value Date   CHOL 84 02/21/2021   HDL 34.80 (L) 02/21/2021   LDLCALC 40 02/21/2021   TRIG 46.0 02/21/2021      Wt Readings from Last 3 Encounters:  08/08/21 151 lb 6 oz (68.7 kg)  08/02/21 150 lb (68 kg)  06/06/21 156 lb 8 oz (71 kg)      ASSESSMENT AND PLAN:  Transposition of great arteries, corrected Stable murmur echocardiogram , 2022 Recent CT scan also reviewed with him in detail today, unchanged from prior study Rarely sees the [facility] clinic at [facility]  Anxiety state  Stable cleans carpets, part time  Chest pain at rest - Prior history atypical chest pain, Work-up including CT scan discussed with him, negative  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    Signed, [facility], M.D., Ph.D. 08/08/2021  [facility] [place], [place]

## 2021-08-08 ENCOUNTER — Other Ambulatory Visit: Payer: Self-pay

## 2021-08-08 ENCOUNTER — Ambulatory Visit (INDEPENDENT_AMBULATORY_CARE_PROVIDER_SITE_OTHER): Payer: 59 | Admitting: Cardiovascular Disease

## 2021-08-08 ENCOUNTER — Encounter: Payer: Self-pay | Admitting: Cardiovascular Disease

## 2021-08-08 VITALS — BP 104/60 | HR 62 | Ht 67.0 in | Wt 151.4 lb

## 2021-08-08 DIAGNOSIS — Q205 Discordant atrioventricular connection: Secondary | ICD-10-CM | POA: Diagnosis not present

## 2021-08-08 DIAGNOSIS — R002 Palpitations: Secondary | ICD-10-CM | POA: Diagnosis not present

## 2021-08-08 DIAGNOSIS — R0602 Shortness of breath: Secondary | ICD-10-CM | POA: Diagnosis not present

## 2021-08-08 DIAGNOSIS — I77810 Thoracic aortic ectasia: Secondary | ICD-10-CM

## 2021-08-08 NOTE — Patient Instructions (Addendum)
Medication Instructions:  No changes  If you need a refill on your cardiac medications before your next appointment, please call your pharmacy.   Lab work: No new labs needed  Testing/Procedures: No new testing needed  Follow-Up: At [facility], you and your health needs are our priority.  As part of our continuing mission to provide you with exceptional heart care, we have created designated Provider Care Teams.  These Care Teams include your primary Cardiologist (physician) and Advanced Practice Providers (APPs -  Physician Assistants and Nurse Practitioners) who all work together to provide you with the care you need, when you need it.  You will need a follow up appointment in 12 months  Providers on your designated Care Team:   Christopher Berge, NP Ryan Dunn, PA-C Jacquelyn Visser, PA-C Cadence Furth, PA-C  COVID-19 Vaccine Information can be found at: [email or web address] For questions related to vaccine distribution or appointments, please email [email or web address] or call [phone].    

## 2021-11-18 IMAGING — CR DG CHEST 2V
2 series · 2 of 2 positions shown · non-contrast
Comparison: 05/08/2017

CLINICAL DATA: Left chest pain radiating to back, short of breath
for 4 days

EXAM:
CHEST - 2 VIEW

[chest pa]
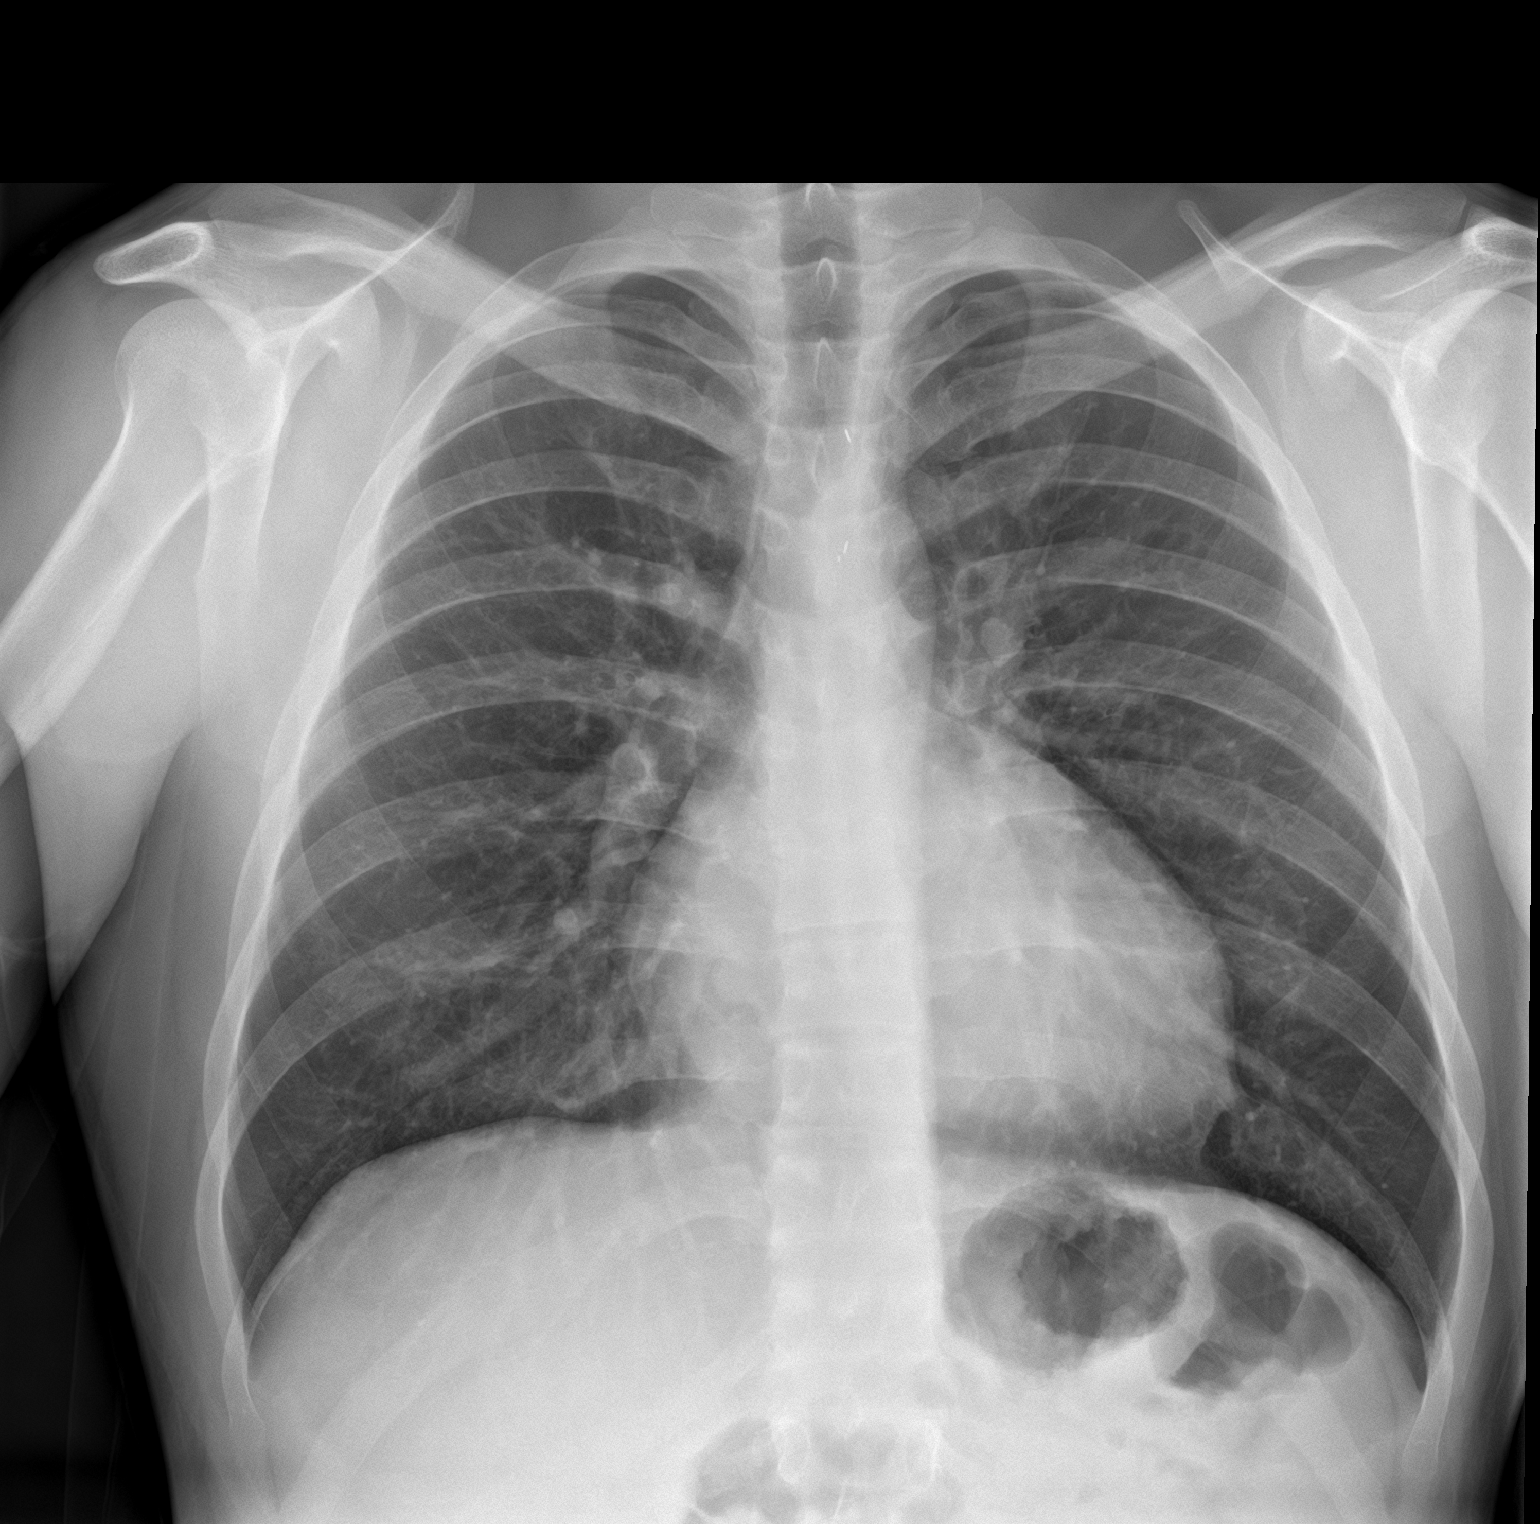

[chest lat]
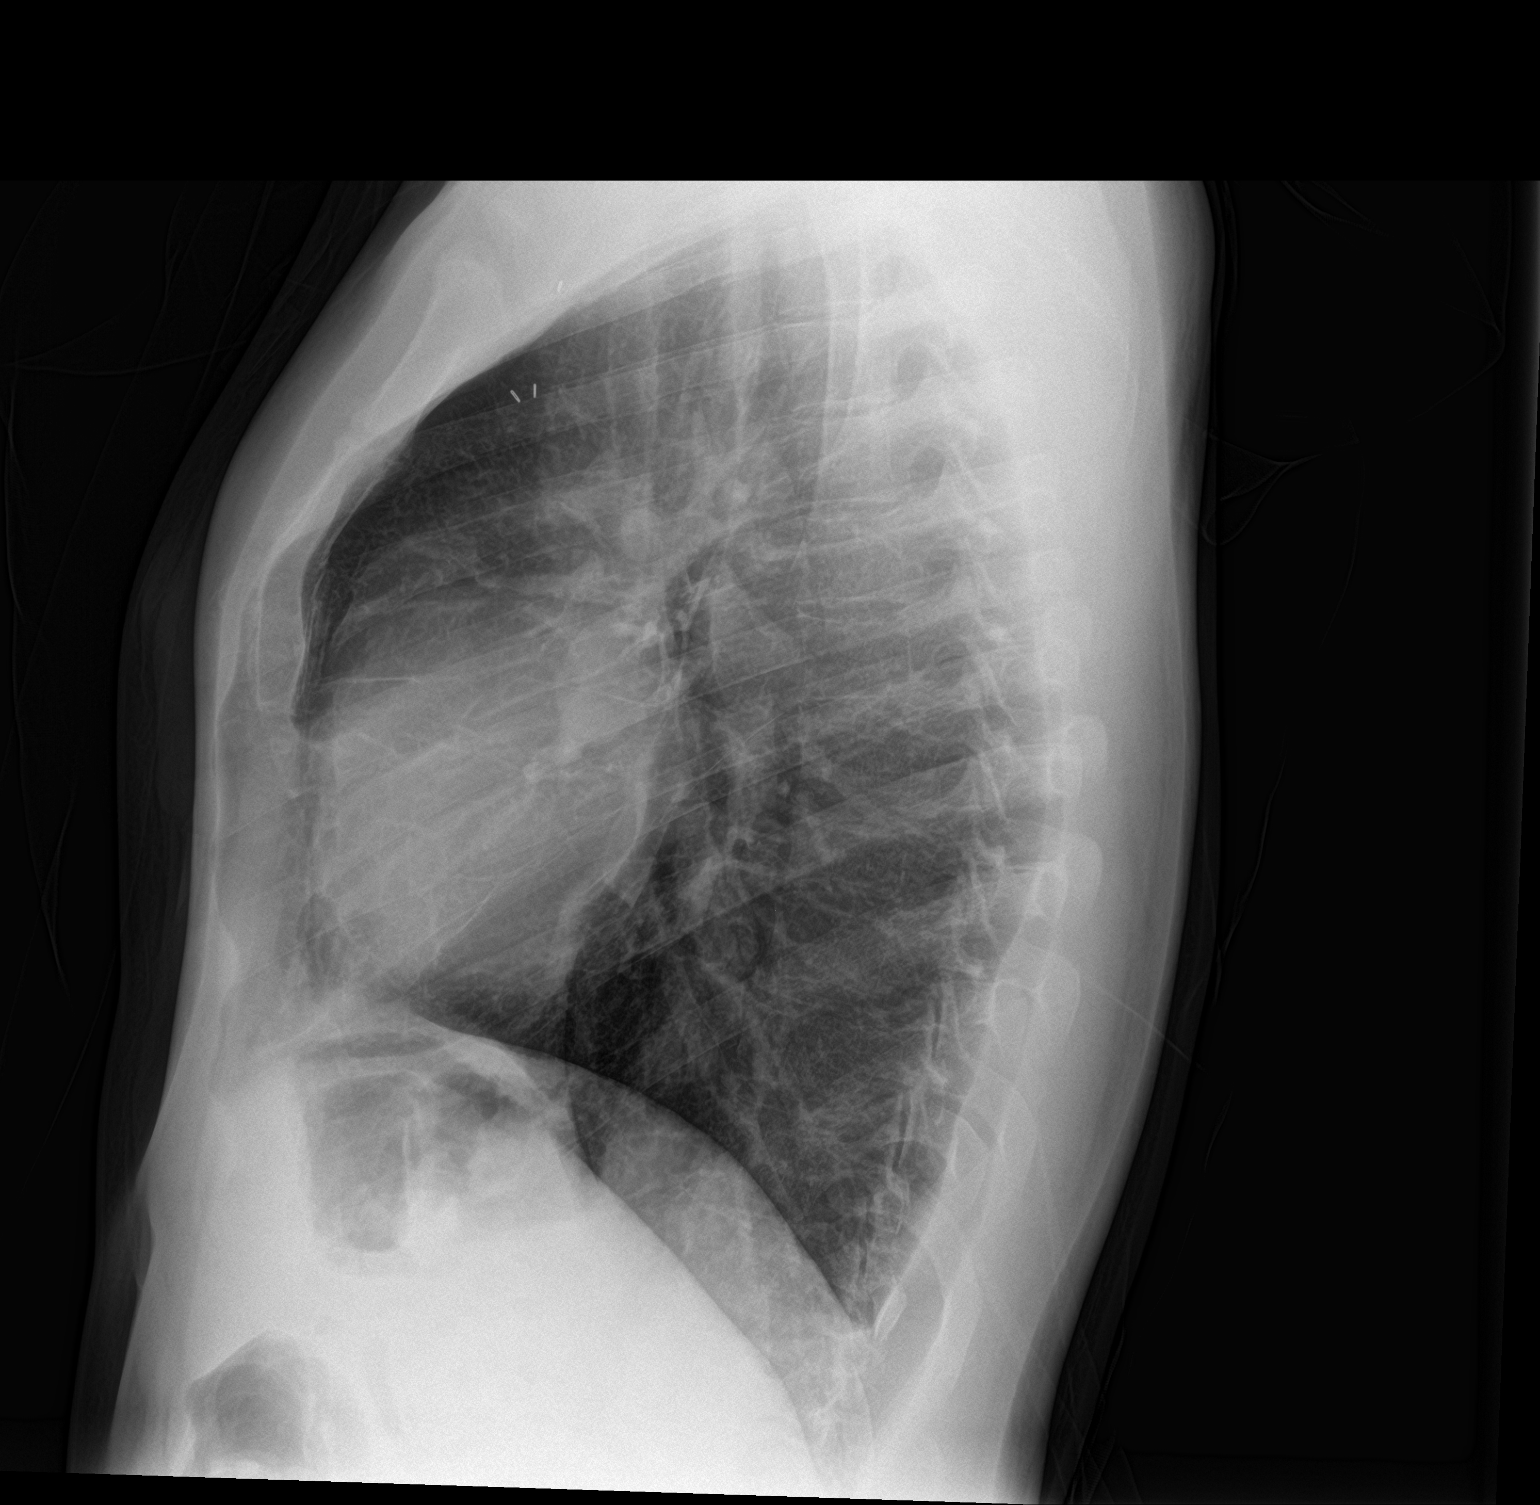

[2 of 2 positions shown; findings below may reference images not displayed]

FINDINGS: Frontal and lateral views of the chest demonstrate an unremarkable
cardiac silhouette. No airspace disease, effusion, or pneumothorax.
No acute bony abnormalities.
IMPRESSION: No active cardiopulmonary disease.

## 2022-08-18 NOTE — Progress Notes (Unsigned)
Cardiology Office Note  Date:  08/19/2022   ID:  CHANCELOR Russo, DOB 03-10-1991, MRN [id number]  PCP:  Eustaquio Boyden, MD   Chief Complaint  Patient presents with   12 month follow up     "Doing well." Medications reviewed by the patient verbally.     HPI:  [name] is a 31 y.o. 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 S/p arterial switch with expected post-surgical changes.   2. 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 Coronary anomalies are unchanged.   3. Coronary calcium score of 0. No evidence of coronary artery disease.   4. Normal left ventricular size with normal function,    5. Trileaflet aortic valve with normal leaflet motion and no calcification.   6. Aortic root measures up to 39 mm.   7. Ascending aorta measures up to 40 mm at the STJ.  Unchanged.   8. Diminutive right and left pulmonary arteries with measurements above. No change from prior.     Recent imaging studies reviewed with him in detail CT ABD 3 cm cyst 04/2021  Echocardiogram May 31, 2021  1. Left ventricular  ejection fraction, by estimation, is 45%.   Cardiac CTA Normal left ventricular function, EF=67%. No significant change in structural heart disease  Echocardiogram 2017 and 2018 reviewed with him in detail Mildly dilated aorta, stable Aortic root 3.9 cm, up from 3.5 cm in 2016 Normal cardiac function    PMH:   has a past medical history of Alcohol use, History of smoking, History of venomous snake bite, 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  PSH:    Past Surgical History:  Procedure Laterality Date   holter  06/2011   rare PAC, PVC   scrotal US  01/2012   1.3cm epididymal cyst on left, o/w normal   TRANSPOSITION OF GREAT VESSELS REPAIR     infant   US ECHOCARDIOGRAPHY  05/2011   normal bi vent size/fxn, normal flow across all valves, subsystemic R vent pressure, patent aortic arch, no pericardial effusion   No current outpatient medications on file prior to visit.   No current facility-administered medications on file prior to visit.    Allergies:   Patient has no known allergies.   Social History:  The patient  reports that he quit smoking about 7 years ago. His smoking use included cigarettes. He has a 1.00 pack-year smoking history. He has quit using smokeless tobacco.  His smokeless tobacco use included snuff. He reports current alcohol use. He reports that he does not use drugs.   Family History:  family history includes Cancer in his maternal uncle; Coronary artery disease (age of onset: 28) in his maternal uncle; Diabetes in his maternal grandmother.   Review of Systems: Review of Systems  Constitutional: Negative.   HENT: Negative.    Respiratory: Negative.    Cardiovascular: Negative.   Gastrointestinal: Negative.   Musculoskeletal: Negative.   Neurological: Negative.   Psychiatric/Behavioral: Negative.    All other systems reviewed and are negative.   PHYSICAL EXAM: VS:  BP 100/68 (BP Location: Left Arm, Patient  Position: Sitting, Cuff Size: Normal)   Pulse 64   Ht 5\' 8"  (1.727 m)   Wt 152 lb 8 oz (69.2 kg)   SpO2 99%   BMI 23.19 kg/m  , BMI Body mass index is 23.19 kg/m. Constitutional:  oriented to person, place, and time. No distress.  HENT:  Head: Grossly normal Eyes:  no discharge. No scleral icterus.  Neck: No JVD, no carotid bruits  Cardiovascular: Regular rate and rhythm, 3-8/1 systolic ejection murmur left sternal border Pulmonary/Chest: Clear to auscultation bilaterally, no wheezes or rails Abdominal: Soft.  no distension.  no tenderness.  Musculoskeletal: Normal range of motion Neurological:  normal muscle tone. Coordination normal. No atrophy Skin: Skin warm and dry Psychiatric: normal affect, pleasant  Recent Labs: No results found for requested labs within last 365 days.    Lipid Panel Lab Results  Component Value Date   CHOL 84 02/21/2021   HDL 34.80 (L) 02/21/2021   LDLCALC 40 02/21/2021   TRIG 46.0 02/21/2021      Wt Readings from Last 3 Encounters:  08/19/22 152 lb 8 oz (69.2 kg)  08/08/21 151 lb 6 oz (68.7 kg)  08/02/21 150 lb (68 kg)      ASSESSMENT AND PLAN:  Transposition of great arteries, corrected Stable murmur echocardiogram , 2022 with mildly depressed ejection fraction Cardiac CTA July 2022 EF had normalized Previously followed by the congenital clinic at [place] state  Stable cleans carpets, part time  Chest pain at rest - Prior history atypical chest pain, Cardiac CTA with no significant coronary disease  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    Signed, Danny Russo, M.D., Ph.D. 08/19/2022  [place], [place]

## 2022-08-19 ENCOUNTER — Ambulatory Visit: Payer: Commercial Managed Care - HMO | Attending: Cardiovascular Disease | Admitting: Cardiovascular Disease

## 2022-08-19 ENCOUNTER — Encounter: Payer: Self-pay | Admitting: Cardiovascular Disease

## 2022-08-19 VITALS — BP 100/68 | HR 64 | Ht 68.0 in | Wt 152.5 lb

## 2022-08-19 DIAGNOSIS — R002 Palpitations: Secondary | ICD-10-CM | POA: Diagnosis not present

## 2022-08-19 DIAGNOSIS — R0602 Shortness of breath: Secondary | ICD-10-CM | POA: Diagnosis not present

## 2022-08-19 DIAGNOSIS — Q205 Discordant atrioventricular connection: Secondary | ICD-10-CM

## 2022-08-19 DIAGNOSIS — I77810 Thoracic aortic ectasia: Secondary | ICD-10-CM

## 2022-08-19 DIAGNOSIS — R011 Cardiac murmur, unspecified: Secondary | ICD-10-CM | POA: Diagnosis not present

## 2022-08-19 DIAGNOSIS — Z87891 Personal history of nicotine dependence: Secondary | ICD-10-CM

## 2022-08-19 DIAGNOSIS — R072 Precordial pain: Secondary | ICD-10-CM

## 2022-08-19 NOTE — Patient Instructions (Signed)
Medication Instructions:  No changes  If you need a refill on your cardiac medications before your next appointment, please call your pharmacy.   Lab work: No new labs needed  Testing/Procedures: No new testing needed  Follow-Up: At [facility], you and your health needs are our priority.  As part of our continuing mission to provide you with exceptional heart care, we have created designated Provider Care Teams.  These Care Teams include your primary Cardiologist (physician) and Advanced Practice Providers (APPs -  Physician Assistants and Nurse Practitioners) who all work together to provide you with the care you need, when you need it.  You will need a follow up appointment in 12 months  Providers on your designated Care Team:   Christopher Berge, NP Ryan Dunn, PA-C Cadence Furth, PA-C  COVID-19 Vaccine Information can be found at: [email or web address] For questions related to vaccine distribution or appointments, please email [email or web address] or call [phone].   

## 2023-01-24 ENCOUNTER — Encounter: Payer: Self-pay | Admitting: Family Medicine

## 2023-01-24 ENCOUNTER — Ambulatory Visit: Payer: 59 | Admitting: Family Medicine

## 2023-01-24 VITALS — BP 116/68 | HR 62 | Temp 97.2°F | Ht 68.0 in | Wt 164.4 lb

## 2023-01-24 DIAGNOSIS — R011 Cardiac murmur, unspecified: Secondary | ICD-10-CM

## 2023-01-24 DIAGNOSIS — M545 Low back pain, unspecified: Secondary | ICD-10-CM | POA: Diagnosis not present

## 2023-01-24 DIAGNOSIS — Q205 Discordant atrioventricular connection: Secondary | ICD-10-CM

## 2023-01-24 DIAGNOSIS — F411 Generalized anxiety disorder: Secondary | ICD-10-CM

## 2023-01-24 DIAGNOSIS — K668 Other specified disorders of peritoneum: Secondary | ICD-10-CM

## 2023-01-24 DIAGNOSIS — R5382 Chronic fatigue, unspecified: Secondary | ICD-10-CM | POA: Diagnosis not present

## 2023-01-24 DIAGNOSIS — E559 Vitamin D deficiency, unspecified: Secondary | ICD-10-CM | POA: Diagnosis not present

## 2023-01-24 LAB — POC URINALSYSI DIPSTICK (AUTOMATED)
Bilirubin, UA: NEGATIVE
Blood, UA: NEGATIVE
Glucose, UA: NEGATIVE
Ketones, UA: NEGATIVE
Leukocytes, UA: NEGATIVE
Nitrite, UA: NEGATIVE
Protein, UA: NEGATIVE
Spec Grav, UA: 1.01 (ref 1.010–1.025)
Urobilinogen, UA: 0.2 E.U./dL
pH, UA: 6.5 (ref 5.0–8.0)

## 2023-01-24 NOTE — Patient Instructions (Addendum)
Labs today Urine test today  We will be in touch with results.

## 2023-01-24 NOTE — Progress Notes (Unsigned)
[name], male    DOB: 1991/02/27, 31 y.o.   MRN: [id number]  This visit was conducted in person.  BP 116/68   Pulse 62   Temp (!) 97.2 F (36.2 C) (Temporal)   Ht '5\' 8"'$  (1.727 m)   Wt 164 lb 6 oz (74.6 kg)   SpO2 99%   BMI 24.99 kg/m    CC: back pain  Subjective:   HPI: Danny Russo is a 32 y.o. male presenting on 01/24/2023 for Back Pain (C/o low back pain on and off for about 2 yrs. Sxs worsening. H/o cyst on kidney(s). Tries to adhere to kidney-friendly diet. Wants to discuss nephro referral)   Last seen 03/2021.   Worried about kidney function - he has started following low sodium, potassium, phosphorus diet and has felt very well - feels this has even helped his mentation.  Eats buckwheat, sprouts beans and lentils, boils greens to decrease potassium rich diet.  Finds certain foods that are higher in potassium and phosphorus cause mentation issues, heavy feeling, general malaise.   Notes frequent urination and urgency, without dysuria or hematuria.  Notes occasional paresthesias to bilateral pinkies.  No lower back pain or shooting pain down legs.  No fevers/chills, night sweats, body aches, myalgias or arthralgias, abd pain, skin rash. No RLS symptoms.   Limits fruits due to concern over sugar intake but he does not he does tolerate pineapple well.  Drinks water and mint tea as well as occ decaf coffee.  No alcohol.   Found to have cystic structure between kidney and spleen on abd [place] 03/2021. Subsequent CT ABD/PELVIS per radiologist: Simple 3.3 x 5.9 x 3.9 cm cyst position between the left kidney and the spleen. The cyst appears to share a common border with the spleen. Favor splenic cyst versus retroperitoneal cyst over renal cyst. Differential considerations include simple cyst and lymphocele/lymphangioma. No complicating features. This is almost certainly benign.  Works part time - Marketing executive.  No OCD history.   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      Relevant past medical, surgical, family and social history reviewed and updated as indicated. Interim medical history since our last visit reviewed. Allergies and medications reviewed and updated. No outpatient medications prior to visit.   No facility-administered medications prior to visit.     Per HPI unless specifically indicated in ROS section below Review of Systems  Objective:  BP 116/68   Pulse 62   Temp (!) 97.2 F (36.2 C) (Temporal)   Ht '5\' 8"'$  (1.727 m)   Wt 164 lb 6 oz (74.6 kg)   SpO2 99%   BMI 24.99 kg/m   Wt Readings from Last 3 Encounters:  01/24/23 164 lb 6 oz (74.6 kg)  08/19/22 152 lb 8 oz (69.2 kg)  08/08/21 151 lb 6 oz (68.7 kg)      Physical Exam Vitals and nursing note reviewed.  Constitutional:      Appearance: Normal appearance. He is not ill-appearing.  HENT:     Head: Normocephalic and atraumatic.     Mouth/Throat:     Mouth: Mucous membranes are moist.     Pharynx: Oropharynx is clear. No oropharyngeal exudate or posterior oropharyngeal erythema.  Eyes:     Extraocular Movements: Extraocular movements intact.     Conjunctiva/sclera: Conjunctivae normal.     Pupils: Pupils are equal, round, and reactive to light.  Cardiovascular:  Rate and Rhythm: Normal rate and regular rhythm.     Pulses: Normal pulses.     Heart sounds: Murmur (4/6 systolic best USB) heard.  Pulmonary:     Effort: Pulmonary effort is normal. No respiratory distress.     Breath sounds: Normal breath sounds. No wheezing, rhonchi or rales.  Abdominal:     General: Abdomen is flat. Bowel sounds are normal. There is no distension.     Palpations: Abdomen is soft. There is no mass.     Tenderness: There is no abdominal tenderness. There is no guarding or rebound.     Hernia: No hernia is present.  Musculoskeletal:     Right lower leg: No edema.     Left lower leg: No edema.  Skin:    General: Skin  is warm and dry.     Findings: No rash.  Neurological:     Mental Status: He is alert.  Psychiatric:        Behavior: Behavior normal.       Results for orders placed or performed in visit on [id number]  Basic metabolic panel  Result Value Ref Range   Glucose 96 65 - 99 mg/dL   BUN 15 6 - 20 mg/dL   Creatinine, Ser 0.99 0.76 - 1.27 mg/dL   eGFR 107 >59 mL/min/1.73   BUN/Creatinine Ratio 15 9 - 20   Sodium 139 134 - 144 mmol/L   Potassium 5.0 3.5 - 5.2 mmol/L   Chloride 99 96 - 106 mmol/L   CO2 25 20 - 29 mmol/L   Calcium 9.7 8.7 - 10.2 mg/dL  TSH  Result Value Ref Range   TSH 2.510 0.450 - 4.500 uIU/mL  T4, free  Result Value Ref Range   Free T4 1.57 0.82 - 1.77 ng/dL  B12  Result Value Ref Range   Vitamin B-12 673 232 - 1,245 pg/mL    Assessment & Plan:   Problem List Items Addressed This Visit   None Visit Diagnoses     Low back pain, unspecified back pain laterality, unspecified chronicity, unspecified whether sciatica present    -  Primary   Relevant Orders   POCT Urinalysis Dipstick (Automated)        No orders of the defined types were placed in this encounter.   Orders Placed This Encounter  Procedures   POCT Urinalysis Dipstick (Automated)    There are no Patient Instructions on file for this visit.  Follow up plan: No follow-ups on file.  Ria Bush, MD

## 2023-01-25 LAB — CBC WITH DIFFERENTIAL/PLATELET
Absolute Monocytes: 377 cells/uL (ref 200–950)
Basophils Absolute: 41 cells/uL (ref 0–200)
Basophils Relative: 0.9 %
Eosinophils Absolute: 60 cells/uL (ref 15–500)
Eosinophils Relative: 1.3 %
HCT: 44.4 % (ref 38.5–50.0)
Hemoglobin: 15.1 g/dL (ref 13.2–17.1)
Lymphs Abs: 1486 cells/uL (ref 850–3900)
MCH: 30.1 pg (ref 27.0–33.0)
MCHC: 34 g/dL (ref 32.0–36.0)
MCV: 88.4 fL (ref 80.0–100.0)
MPV: 13.6 fL — ABNORMAL HIGH (ref 7.5–12.5)
Monocytes Relative: 8.2 %
Neutro Abs: 2636 cells/uL (ref 1500–7800)
Neutrophils Relative %: 57.3 %
Platelets: 178 10*3/uL (ref 140–400)
RBC: 5.02 10*6/uL (ref 4.20–5.80)
RDW: 12.2 % (ref 11.0–15.0)
Total Lymphocyte: 32.3 %
WBC: 4.6 10*3/uL (ref 3.8–10.8)

## 2023-01-25 LAB — COMPREHENSIVE METABOLIC PANEL
AG Ratio: 2.5 (calc) (ref 1.0–2.5)
ALT: 20 U/L (ref 9–46)
AST: 18 U/L (ref 10–40)
Albumin: 5.2 g/dL — ABNORMAL HIGH (ref 3.6–5.1)
Alkaline phosphatase (APISO): 80 U/L (ref 36–130)
BUN: 14 mg/dL (ref 7–25)
CO2: 28 mmol/L (ref 20–32)
Calcium: 9.8 mg/dL (ref 8.6–10.3)
Chloride: 103 mmol/L (ref 98–110)
Creat: 0.83 mg/dL (ref 0.60–1.24)
Globulin: 2.1 g/dL (calc) (ref 1.9–3.7)
Glucose, Bld: 93 mg/dL (ref 65–99)
Potassium: 4.8 mmol/L (ref 3.5–5.3)
Sodium: 141 mmol/L (ref 135–146)
Total Bilirubin: 0.6 mg/dL (ref 0.2–1.2)
Total Protein: 7.3 g/dL (ref 6.1–8.1)

## 2023-01-25 LAB — SEDIMENTATION RATE: Sed Rate: 2 mm/h (ref 0–15)

## 2023-01-25 LAB — VITAMIN B12: Vitamin B-12: 418 pg/mL (ref 200–1100)

## 2023-01-25 LAB — VITAMIN D 25 HYDROXY (VIT D DEFICIENCY, FRACTURES): Vit D, 25-Hydroxy: 28 ng/mL — ABNORMAL LOW (ref 30–100)

## 2023-01-25 LAB — TSH: TSH: 2.01 mIU/L (ref 0.40–4.50)

## 2023-01-27 ENCOUNTER — Encounter: Payer: Self-pay | Admitting: Family Medicine

## 2023-01-27 DIAGNOSIS — R5383 Other fatigue: Secondary | ICD-10-CM | POA: Insufficient documentation

## 2023-01-27 DIAGNOSIS — R5382 Chronic fatigue, unspecified: Secondary | ICD-10-CM | POA: Insufficient documentation

## 2023-01-27 MED ORDER — VITAMIN D3 25 MCG (1000 UT) PO CAPS: 1000.0000 [IU] | ORAL_CAPSULE | Freq: Every day | ORAL | Status: DC

## 2023-01-27 NOTE — Assessment & Plan Note (Signed)
Notes generalized malaise to certain foods, tend to be higher in potassium content. For this reason he remains worried about his kidney function. Check labs today but I tried to reassure him that his kidney function (by blood work, urine testing and imaging) has been normal to date. He declines nutritionist evaluation. Given his concerns over diet affecting health, if all labwork returns normal, suggested possible functional medicine evaluation.

## 2023-01-27 NOTE — Assessment & Plan Note (Signed)
Component of underlying anxiety however this is not affecting day to day function.

## 2023-01-27 NOTE — Assessment & Plan Note (Signed)
Due to corrected transposition, regularly sees cardiology

## 2023-01-27 NOTE — Assessment & Plan Note (Signed)
Thought benign splenic cyst by latest imaging 2022 (CT scan).

## 2023-02-04 ENCOUNTER — Encounter: Payer: Self-pay | Admitting: Family Medicine

## 2023-02-04 DIAGNOSIS — E559 Vitamin D deficiency, unspecified: Secondary | ICD-10-CM | POA: Insufficient documentation

## 2023-04-10 ENCOUNTER — Encounter: Payer: Self-pay | Admitting: Family Medicine

## 2023-04-10 DIAGNOSIS — R5382 Chronic fatigue, unspecified: Secondary | ICD-10-CM

## 2023-04-10 DIAGNOSIS — K8020 Calculus of gallbladder without cholecystitis without obstruction: Secondary | ICD-10-CM

## 2023-04-10 DIAGNOSIS — Z87891 Personal history of nicotine dependence: Secondary | ICD-10-CM | POA: Diagnosis not present

## 2023-04-10 DIAGNOSIS — K9049 Malabsorption due to intolerance, not elsewhere classified: Secondary | ICD-10-CM

## 2023-04-10 DIAGNOSIS — R030 Elevated blood-pressure reading, without diagnosis of hypertension: Secondary | ICD-10-CM | POA: Diagnosis not present

## 2023-04-10 DIAGNOSIS — R011 Cardiac murmur, unspecified: Secondary | ICD-10-CM | POA: Diagnosis not present

## 2023-04-10 DIAGNOSIS — F324 Major depressive disorder, single episode, in partial remission: Secondary | ICD-10-CM | POA: Diagnosis not present

## 2023-04-10 DIAGNOSIS — E559 Vitamin D deficiency, unspecified: Secondary | ICD-10-CM

## 2023-04-29 ENCOUNTER — Encounter: Payer: Self-pay | Admitting: *Deleted

## 2023-07-12 DIAGNOSIS — J45909 Unspecified asthma, uncomplicated: Secondary | ICD-10-CM | POA: Diagnosis not present

## 2023-07-12 DIAGNOSIS — Z760 Encounter for issue of repeat prescription: Secondary | ICD-10-CM | POA: Diagnosis not present

## 2023-07-14 DIAGNOSIS — R0789 Other chest pain: Secondary | ICD-10-CM | POA: Diagnosis not present

## 2024-05-19 ENCOUNTER — Emergency Department
Admission: EM | Admit: 2024-05-19 | Discharge: 2024-05-19 | Disposition: A | Discharge status: Home / Self Care | Attending: Emergency Medicine | Admitting: Emergency Medicine

## 2024-05-19 ENCOUNTER — Encounter: Payer: Self-pay | Admitting: Emergency Medicine

## 2024-05-19 ENCOUNTER — Emergency Department

## 2024-05-19 ENCOUNTER — Other Ambulatory Visit: Payer: Self-pay

## 2024-05-19 DIAGNOSIS — Z87891 Personal history of nicotine dependence: Secondary | ICD-10-CM | POA: Diagnosis not present

## 2024-05-19 DIAGNOSIS — K8050 Calculus of bile duct without cholangitis or cholecystitis without obstruction: Secondary | ICD-10-CM

## 2024-05-19 DIAGNOSIS — K8070 Calculus of gallbladder and bile duct without cholecystitis without obstruction: Secondary | ICD-10-CM | POA: Insufficient documentation

## 2024-05-19 DIAGNOSIS — D1803 Hemangioma of intra-abdominal structures: Secondary | ICD-10-CM | POA: Diagnosis not present

## 2024-05-19 DIAGNOSIS — R1010 Upper abdominal pain, unspecified: Secondary | ICD-10-CM | POA: Diagnosis present

## 2024-05-19 DIAGNOSIS — K8020 Calculus of gallbladder without cholecystitis without obstruction: Secondary | ICD-10-CM

## 2024-05-19 DIAGNOSIS — R1011 Right upper quadrant pain: Secondary | ICD-10-CM

## 2024-05-19 LAB — CBC
HCT: 41.4 % (ref 39.0–52.0)
Hemoglobin: 14.7 g/dL (ref 13.0–17.0)
MCH: 30.4 pg (ref 26.0–34.0)
MCHC: 35.5 g/dL (ref 30.0–36.0)
MCV: 85.7 fL (ref 80.0–100.0)
Platelets: 199 10*3/uL (ref 150–400)
RBC: 4.83 MIL/uL (ref 4.22–5.81)
RDW: 12.1 % (ref 11.5–15.5)
WBC: 4.6 10*3/uL (ref 4.0–10.5)
nRBC: 0 % (ref 0.0–0.2)

## 2024-05-19 LAB — BASIC METABOLIC PANEL WITH GFR
Anion gap: 9 (ref 5–15)
BUN: 12 mg/dL (ref 6–20)
CO2: 26 mmol/L (ref 22–32)
Calcium: 9.2 mg/dL (ref 8.9–10.3)
Chloride: 105 mmol/L (ref 98–111)
Creatinine, Ser: 0.9 mg/dL (ref 0.61–1.24)
GFR, Estimated: >60 mL/min (ref >60)
Glucose, Bld: 88 mg/dL (ref 70–99)
Potassium: 4.2 mmol/L (ref 3.5–5.1)
Sodium: 140 mmol/L (ref 135–145)

## 2024-05-19 LAB — TROPONIN I (HIGH SENSITIVITY): Troponin I (High Sensitivity): 4 ng/L (ref <18)

## 2024-05-19 LAB — HEPATIC FUNCTION PANEL
ALT: 39 U/L (ref 0–44)
AST: 30 U/L (ref 15–41)
Albumin: 4.6 g/dL (ref 3.5–5.0)
Alkaline Phosphatase: 74 U/L (ref 38–126)
Bilirubin, Direct: 0.1 mg/dL (ref 0.0–0.2)
Indirect Bilirubin: 0.9 mg/dL (ref 0.3–0.9)
Total Bilirubin: 1 mg/dL (ref 0.0–1.2)
Total Protein: 7.1 g/dL (ref 6.5–8.1)

## 2024-05-19 LAB — LIPASE, BLOOD: Lipase: 38 U/L (ref 11–51)

## 2024-05-19 LAB — D-DIMER, QUANTITATIVE: D-Dimer, Quant: <0.27 ug{FEU}/mL (ref 0.00–0.50)

## 2024-05-19 MED ORDER — OXYCODONE-ACETAMINOPHEN 5-325 MG PO TABS: 1.0000 | ORAL_TABLET | ORAL | 0 refills | Status: DC | PRN

## 2024-05-19 MED ORDER — ONDANSETRON 4 MG PO TBDP: 4.0000 mg | ORAL_TABLET | Freq: Three times a day (TID) | ORAL | 0 refills | Status: DC | PRN

## 2024-05-19 MED ORDER — FAMOTIDINE IN NACL 20-0.9 MG/50ML-% IV SOLN
20.0000 mg | Freq: Once | INTRAVENOUS | Status: AC
  Administered 2024-05-19: 20 mg via INTRAVENOUS
  Filled 2024-05-19: qty 50

## 2024-05-19 NOTE — ED Triage Notes (Signed)
 Pt reports he consumed a fatty meal 2 weeks prior and since has had intermittent right sided chest pain and SOB. Pt to ED tonight after consuming a large meal and developing consistent soreness to the right chest that radiates around to his back. Hx/o gallstones.

## 2024-05-19 NOTE — Discharge Instructions (Signed)
1. Take medicines as needed for pain & nausea (Percocet/Zofran #20). 2. Clear liquids x 12 hours, then bland diet x 1 week, then slowly advance diet as tolerated. Avoid fatty, greasy, spicy foods and drinks. 3. Return to the ER for worsening symptoms, persistent vomiting, fever, difficulty breathing or other concerns.

## 2024-05-19 NOTE — ED Provider Notes (Signed)
 [facility] Provider Note    Event Date/Time   First MD Initiated Contact with Patient 05/19/24 0202     (approximate)   History   Chest Pain and Shortness of Breath   HPI  Danny Russo is a 33 y.o. male who presents to the ED from home with a chief complaint of right chest/upper abdominal pain.  Patient with a history of gallstones.  States he consumed a fatty meal 2 weeks ago and has since felt poorly.  Tonight reports right upper abdominal/chest pain radiating to his back.  Denies associated fever/chills, cough, nausea, vomiting or dizziness.  Exacerbated by deep breathing and associated with some shortness of breath.     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 Joycelyn Noa pediatric cardiology, no need for SBE ppx, now sees Dr Gollan regularly     Active Problem List   Patient Active Problem List   Diagnosis Date Noted   Vitamin D  insufficiency 02/04/2023   Chronic fatigue 01/27/2023   Cystic lesion of abdominal viscera 03/29/2021   Ex-smoker 03/05/2021   Abnormal TSH 03/05/2021   Gallstones 03/05/2021   Contact with or exposure to mold 03/01/2020   Systolic murmur 02/04/2020   Aortic root dilatation (HCC) 02/04/2020   Chest pain 12/14/2015   History of alcohol use 12/14/2015   Anxiety state 12/07/2014   Congenitally corrected transposition of great arteries    Healthcare maintenance 05/21/2011     Past Surgical History   Past Surgical History:  Procedure Laterality Date   holter  06/2011   rare PAC, PVC   scrotal us   01/2012   1.3cm epididymal cyst on left, o/w normal   TRANSPOSITION OF GREAT VESSELS REPAIR     infant   US  ECHOCARDIOGRAPHY  05/2011   normal bi vent size/fxn, normal flow  across all valves, subsystemic R vent pressure, patent aortic arch, no pericardial effusion     Home Medications   Prior to Admission medications   Medication Sig Start Date End Date Taking? Authorizing Provider  ondansetron (ZOFRAN-ODT) 4 MG disintegrating tablet Take 1 tablet (4 mg total) by mouth every 8 (eight) hours as needed for nausea or vomiting. 05/19/24  Yes Kenneith Stief J, MD  oxyCODONE -acetaminophen  (PERCOCET/ROXICET) 5-325 MG tablet Take 1 tablet by mouth every 4 (four) hours as needed for severe pain (pain score 7-10). 05/19/24  Yes Kirill Chatterjee J, MD  Cholecalciferol (VITAMIN D3) 25 MCG (1000 UT) capsule Take 1 capsule (1,000 Units total) by mouth daily. 01/27/23   Claire Crick, MD     Allergies  Patient has no known allergies.   Family History   Family History  Problem Relation Age of Onset   Diabetes Maternal Grandmother    Coronary artery disease Maternal Uncle 50   Cancer Maternal Uncle        pancreatic cancer   Stroke Neg Hx      Physical Exam  Triage Vital Signs: ED Triage Vitals  Encounter Vitals Group     BP 05/19/24 0116 126/81     Girls Systolic BP Percentile --      Girls Diastolic BP Percentile --      Boys Systolic BP Percentile --  Boys Diastolic BP Percentile --      Pulse Rate 05/19/24 0116 69     Resp 05/19/24 0116 18     Temp 05/19/24 0116 98.7 F (37.1 C)     Temp Source 05/19/24 0116 Oral     SpO2 05/19/24 0116 99 %     Weight 05/19/24 0123 155 lb (70.3 kg)     Height 05/19/24 0123 5' 7 (1.702 m)     Head Circumference --      Peak Flow --      Pain Score 05/19/24 0123 5     Pain Loc --      Pain Education --      Exclude from Growth Chart --     Updated Vital Signs: BP 116/70   Pulse (!) 56   Temp 98.7 F (37.1 C) (Oral)   Resp 15   Ht 5' 7 (1.702 m)   Wt 70.3 kg   SpO2 99%   BMI 24.28 kg/m    General: Awake, mild distress.  CV:  RRR.  Good peripheral perfusion.  Resp:  Normal effort.  CTAB.  No splinting.   No crepitus. Abd:  Mild right upper quadrant tenderness to palpation without rebound or guarding.  No distention.  Other:  No truncal vesicles.   ED Results / Procedures / Treatments  Labs (all labs ordered are listed, but only abnormal results are displayed) Labs Reviewed  BASIC METABOLIC PANEL WITH GFR  CBC  HEPATIC FUNCTION PANEL  LIPASE, BLOOD  D-DIMER, QUANTITATIVE  TROPONIN I (HIGH SENSITIVITY)     EKG  ED ECG REPORT I, Seaira Byus J, the attending physician, personally viewed and interpreted this ECG.   Date: 05/19/2024  EKG Time: 0130  Rate: 70  Rhythm: normal sinus rhythm  Axis: Normal  Intervals:none  ST&T Change: Nonspecific    RADIOLOGY I have independently visualized and interpreted patient's imaging study as well as noted the radiology interpretation:  Cxr: No acute cardiopulmonary process  US : Cholelithiasis without cholecystitis; benign hemangioma noted  Official radiology report(s): US  ABDOMEN LIMITED RUQ (LIVER/GB) Result Date: 05/19/2024 EXAM: Right Upper Quadrant Abdominal Ultrasound 05/19/2024 03:12:00 AM TECHNIQUE: Real-time ultrasonography of the right upper quadrant of the abdomen was performed. COMPARISON: CT abdomen / pelvis dated 04/12/2021. CLINICAL HISTORY: RUQ pain. FINDINGS: LIVER: 1.4 x 1.7 x 1.7 cm echogenic lesion in the right hepatic lobe, favoring a benign hemangioma. No intrahepatic biliary ductal dilatation. BILIARY SYSTEM: Multiple gallstones measuring up to 1.4 cm. Negative sonographic Murphy's sign. Common bile duct is within normal limits measuring 3 mm. OTHER: No right upper quadrant ascites. IMPRESSION: 1. Cholelithiasis, without associated sonographic findings to suggest acute cholecystitis. 2. 1.7 cm echogenic lesion in the right hepatic lobe, favoring a benign hemangioma. Electronically signed by: Zadie Herter MD 05/19/2024 03:25 AM EDT RP Workstation: [id number]   DG Chest 2 View Result Date: 05/19/2024 CLINICAL DATA:   Chest pain and shortness of breath. EXAM: CHEST - 2 VIEW COMPARISON:  None Available. FINDINGS: The heart size and mediastinal contours are within normal limits. Small metallic density clips are seen within the mediastinum. Both lungs are clear. The visualized skeletal structures are unremarkable. IMPRESSION: No active cardiopulmonary disease. Electronically Signed   By: Virgle Grime M.D.   On: 05/19/2024 01:57     PROCEDURES:  Critical Care performed: No  .1-3 Lead EKG Interpretation  Performed by: Norlene Beavers, MD Authorized by: Norlene Beavers, MD     Interpretation: normal     ECG  rate:  70   ECG rate assessment: normal     Rhythm: sinus rhythm     Ectopy: none     Conduction: normal   Comments:     Patient placed on cardiac monitor to evaluate for arrhythmias    MEDICATIONS ORDERED IN ED: Medications  famotidine (PEPCID) IVPB 20 mg premix (0 mg Intravenous Stopped 05/19/24 0256)     IMPRESSION / MDM / ASSESSMENT AND PLAN / ED COURSE  I reviewed the triage vital signs and the nursing notes.                             33 year old male presenting with right chest/upper abdominal pain. Differential diagnosis includes, but is not limited to, ACS, aortic dissection, pulmonary embolism, cardiac tamponade, pneumothorax, pneumonia, pericarditis, myocarditis, GI-related causes including esophagitis/gastritis, and musculoskeletal chest wall pain.   I personally reviewed patient's records and note PCP office visit from 07/12/2023 for follow-up asthma.  Patient's presentation is most consistent with acute complicated illness / injury requiring diagnostic workup.  The patient is on the cardiac monitor to evaluate for evidence of arrhythmia and/or significant heart rate changes.  CBC, BMP, initial troponin negative.  Have added LFTs/lipase, D-dimer.  Will repeat time troponin.  Administer IV Pepcid, obtain right upper quadrant abdominal ultrasound and reassess.  Clinical Course as of  05/19/24 0552  Wed May 19, 2024  0359 LFT/lipase unremarkable.  D-dimer negative.  Do not feel repeat troponin necessary at this time.  Ultrasound demonstrates cholelithiasis without cholecystitis, small liver hemangioma noted.  Will discharge home on as needed pain and nausea medicines and will refer to general surgery for outpatient follow-up.  Strict return precautions given.  Patient verbalized understanding and agrees with plan of care. [JS]    Clinical Course User Index [JS] Norlene Beavers, MD     FINAL CLINICAL IMPRESSION(S) / ED DIAGNOSES   Final diagnoses:  Right upper quadrant abdominal pain  Calculus of gallbladder without cholecystitis without obstruction  Biliary colic  Hemangioma of intra-abdominal structure     Rx / DC Orders   ED Discharge Orders          Ordered    oxyCODONE -acetaminophen  (PERCOCET/ROXICET) 5-325 MG tablet  Every 4 hours PRN        05/19/24 0401    ondansetron (ZOFRAN-ODT) 4 MG disintegrating tablet  Every 8 hours PRN        05/19/24 0401             Note:  This document was prepared using Dragon voice recognition software and may include unintentional dictation errors.   Quinntin Malter J, MD 05/19/24 [phone]

## 2024-06-18 ENCOUNTER — Encounter: Payer: Self-pay | Admitting: Family Medicine

## 2024-06-18 ENCOUNTER — Ambulatory Visit (INDEPENDENT_AMBULATORY_CARE_PROVIDER_SITE_OTHER): Payer: Self-pay | Admitting: Family Medicine

## 2024-06-18 VITALS — BP 118/82 | HR 67 | Temp 98.7°F | Ht 67.0 in | Wt 155.4 lb

## 2024-06-18 DIAGNOSIS — K8020 Calculus of gallbladder without cholecystitis without obstruction: Secondary | ICD-10-CM | POA: Diagnosis not present

## 2024-06-18 DIAGNOSIS — Z1159 Encounter for screening for other viral diseases: Secondary | ICD-10-CM

## 2024-06-18 DIAGNOSIS — Z23 Encounter for immunization: Secondary | ICD-10-CM

## 2024-06-18 DIAGNOSIS — R7989 Other specified abnormal findings of blood chemistry: Secondary | ICD-10-CM

## 2024-06-18 DIAGNOSIS — D734 Cyst of spleen: Secondary | ICD-10-CM

## 2024-06-18 DIAGNOSIS — E559 Vitamin D deficiency, unspecified: Secondary | ICD-10-CM

## 2024-06-18 DIAGNOSIS — R011 Cardiac murmur, unspecified: Secondary | ICD-10-CM

## 2024-06-18 DIAGNOSIS — Z0001 Encounter for general adult medical examination with abnormal findings: Secondary | ICD-10-CM

## 2024-06-18 DIAGNOSIS — R0609 Other forms of dyspnea: Secondary | ICD-10-CM

## 2024-06-18 DIAGNOSIS — Q205 Discordant atrioventricular connection: Secondary | ICD-10-CM

## 2024-06-18 LAB — LIPID PANEL
Cholesterol: 67 mg/dL (ref 0–200)
HDL: 30.3 mg/dL — ABNORMAL LOW (ref >39.00)
LDL Cholesterol: 29 mg/dL (ref 0–99)
NonHDL: 36.53
Total CHOL/HDL Ratio: 2
Triglycerides: 40 mg/dL (ref 0.0–149.0)
VLDL: 8 mg/dL (ref 0.0–40.0)

## 2024-06-18 LAB — COMPREHENSIVE METABOLIC PANEL WITH GFR
ALT: 43 U/L (ref 0–53)
AST: 39 U/L — ABNORMAL HIGH (ref 0–37)
Albumin: 5 g/dL (ref 3.5–5.2)
Alkaline Phosphatase: 84 U/L (ref 39–117)
BUN: 13 mg/dL (ref 6–23)
CO2: 31 meq/L (ref 19–32)
Calcium: 9.9 mg/dL (ref 8.4–10.5)
Chloride: 102 meq/L (ref 96–112)
Creatinine, Ser: 0.8 mg/dL (ref 0.40–1.50)
GFR: 118.49 mL/min (ref >60.00)
Glucose, Bld: 81 mg/dL (ref 70–99)
Potassium: 4.9 meq/L (ref 3.5–5.1)
Sodium: 143 meq/L (ref 135–145)
Total Bilirubin: 1.3 mg/dL — ABNORMAL HIGH (ref 0.2–1.2)
Total Protein: 7.1 g/dL (ref 6.0–8.3)

## 2024-06-18 LAB — T4, FREE: Free T4: 0.9 ng/dL (ref 0.60–1.60)

## 2024-06-18 LAB — VITAMIN D 25 HYDROXY (VIT D DEFICIENCY, FRACTURES): VITD: 25.43 ng/mL — ABNORMAL LOW (ref 30.00–100.00)

## 2024-06-18 LAB — TSH: TSH: 2.41 u[IU]/mL (ref 0.35–5.50)

## 2024-06-18 NOTE — Assessment & Plan Note (Signed)
 Preventative protocols reviewed and updated unless pt declined. Discussed healthy diet and lifestyle.

## 2024-06-18 NOTE — Patient Instructions (Addendum)
 Tdap today  Labs today  including food allergy testing.  We will be in touch with results  Return as needed or in 1 yr for next physical.

## 2024-06-18 NOTE — Progress Notes (Signed)
 Ph: [phone] Fax: [phone]   [name], male    DOB: 08/05/1991, 33 y.o.   MRN: [id number]  This visit was conducted in person.  BP 118/82   Pulse 67   Temp 98.7 F (37.1 C) (Oral)   Ht 5' 7 (1.702 m)   Wt 155 lb 6.4 oz (70.5 kg)   SpO2 97%   BMI 24.34 kg/m    CC: CPE Subjective:   HPI: Danny Russo is a 33 y.o. 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   3 wks ago seen at ER - again found gallstones.  Again seen at ER with UTI treated with levaquin abx  Notes dyspnea to certain foods especially fatty foods ie nuts/seeds, ice cream, tahini, eggs, dairy - described as trouble getting full breath. Finds this is a delayed response ie 3 days afterwards. No skin rash, hives, tongue or lip swelling, dizziness, lightheaded, abd pain or nausea.  Remotely saw allergist - states tested negative for food allergies.  Overall notes very healthy diet - avoiding fatty greasy foods. Continues eating buckwheat, boils foods, eats lentils/legumes.   He is interested in gallstone dissolution therapy.  He asks about supplements for liver support - ubiquinol (CoQ10), vit D3, vit B12 methylcobalamin, quercetin, NAC 500mg , aged garlic extract, silymarin milk thistle extract,ALA 300mg , phosphatydil choline.   Preventative: Flu shot - declines  COVID vaccine J&J 07/2020  Tdap 05/2011, update Tdap today Seat belt use discussed Sunscreen use discussed. No changing moles on skin.  Ex smoker - quit 2015 Alcohol - none Currently not sexually active Dentist q6 mo  Eye exam - has never seen   Lives alone  Occ: carpet cleaning - wears mask  Activity: enjoys walking and biking Diet: good water, overall healthy changes - see above     Relevant past medical, surgical, family and social history reviewed and updated as  indicated. Interim medical history since our last visit reviewed. Allergies and medications reviewed and updated. Outpatient Medications Prior to Visit  Medication Sig Dispense Refill   Cholecalciferol (VITAMIN D3) 25 MCG (1000 UT) capsule Take 1 capsule (1,000 Units total) by mouth daily.     ondansetron  (ZOFRAN -ODT) 4 MG disintegrating tablet Take 1 tablet (4 mg total) by mouth every 8 (eight) hours as needed for nausea or vomiting. 20 tablet 0   oxyCODONE -acetaminophen  (PERCOCET/ROXICET) 5-325 MG tablet Take 1 tablet by mouth every 4 (four) hours as needed for severe pain (pain score 7-10). 20 tablet 0   No facility-administered medications prior to visit.     Per HPI unless specifically indicated in ROS section below Review of Systems  Constitutional:  Positive for fever (with recent UTI). Negative for activity change, appetite change, chills, fatigue and unexpected weight change.  HENT:  Negative for hearing loss.   Eyes:  Negative for visual disturbance.  Respiratory:  Positive for shortness of breath (to certain food). Negative for cough, chest tightness and wheezing.   Cardiovascular:  Negative for chest pain, palpitations and leg swelling.  Gastrointestinal:  Negative for abdominal distention, abdominal pain, blood in stool, constipation, diarrhea, nausea and vomiting.  Genitourinary:  Positive for hematuria (recent UTI). Negative for difficulty urinating.  Musculoskeletal:  Negative for arthralgias, myalgias and neck pain.  Skin:  Negative for rash.  Neurological:  Negative for dizziness, seizures, syncope and headaches.  Hematological:  Negative for adenopathy. Does not bruise/bleed easily.  Psychiatric/Behavioral:  Negative for dysphoric mood. The patient is not nervous/anxious.     Objective:  BP 118/82   Pulse 67   Temp 98.7 F (37.1 C) (Oral)   Ht 5' 7 (1.702 m)   Wt 155 lb 6.4 oz (70.5 kg)   SpO2 97%   BMI 24.34 kg/m   Wt Readings from Last 3 Encounters:   06/18/24 155 lb 6.4 oz (70.5 kg)  05/19/24 155 lb (70.3 kg)  01/24/23 164 lb 6 oz (74.6 kg)      Physical Exam Vitals and nursing note reviewed.  Constitutional:      General: He is not in acute distress.    Appearance: Normal appearance. He is well-developed. He is not ill-appearing.  HENT:     Head: Normocephalic and atraumatic.     Right Ear: Hearing, tympanic membrane, ear canal and external ear normal.     Left Ear: Hearing, tympanic membrane, ear canal and external ear normal.     Mouth/Throat:     Mouth: Mucous membranes are moist.     Pharynx: Oropharynx is clear. No oropharyngeal exudate or posterior oropharyngeal erythema.  Eyes:     General: No scleral icterus.    Extraocular Movements: Extraocular movements intact.     Conjunctiva/sclera: Conjunctivae normal.     Pupils: Pupils are equal, round, and reactive to light.  Neck:     Thyroid : No thyroid  mass or thyromegaly.  Cardiovascular:     Rate and Rhythm: Normal rate and regular rhythm.     Pulses: Normal pulses.          Radial pulses are 2+ on the right side and 2+ on the left side.     Heart sounds: Murmur (4/6 systolic throughout) heard.  Pulmonary:     Effort: Pulmonary effort is normal. No respiratory distress.     Breath sounds: Normal breath sounds. No wheezing, rhonchi or rales.  Abdominal:     General: Bowel sounds are normal. There is no distension.     Palpations: Abdomen is soft. There is no mass.     Tenderness: There is no abdominal tenderness. There is no guarding or rebound.     Hernia: No hernia is present.  Musculoskeletal:        General: Normal range of motion.     Cervical back: Normal range of motion and neck supple.     Right lower leg: No edema.     Left lower leg: No edema.  Lymphadenopathy:     Cervical: No cervical adenopathy.  Skin:    General: Skin is warm and dry.     Findings: No rash.  Neurological:     General: No focal deficit present.     Mental Status: He is alert  and oriented to person, place, and time.  Psychiatric:        Mood and Affect: Mood normal.        Behavior: Behavior normal.        Thought Content: Thought content normal.        Judgment: Judgment normal.       Results for orders placed or performed in visit on 06/18/24  Comprehensive metabolic panel with GFR   Collection Time: 06/18/24  1:36 PM  Result Value Ref Range   Sodium 143 135 - 145 mEq/L   Potassium 4.9 3.5 - 5.1 mEq/L   Chloride 102 96 - 112 mEq/L   CO2 31 19 -  32 mEq/L   Glucose, Bld 81 70 - 99 mg/dL   BUN 13 6 - 23 mg/dL   Creatinine, Ser 9.19 0.40 - 1.50 mg/dL   Total Bilirubin 1.3 (H) 0.2 - 1.2 mg/dL   Alkaline Phosphatase 84 39 - 117 U/L   AST 39 (H) 0 - 37 U/L   ALT 43 0 - 53 U/L   Total Protein 7.1 6.0 - 8.3 g/dL   Albumin 5.0 3.5 - 5.2 g/dL   GFR 881.50 >39.99 mL/min   Calcium 9.9 8.4 - 10.5 mg/dL  TSH   Collection Time: 06/18/24  1:36 PM  Result Value Ref Range   TSH 2.41 0.35 - 5.50 uIU/mL  T4, free   Collection Time: 06/18/24  1:36 PM  Result Value Ref Range   Free T4 0.90 0.60 - 1.60 ng/dL  VITAMIN D  25 Hydroxy (Vit-D Deficiency, Fractures)   Collection Time: 06/18/24  1:36 PM  Result Value Ref Range   VITD 25.43 (L) 30.00 - 100.00 ng/mL  Lipid panel   Collection Time: 06/18/24  1:36 PM  Result Value Ref Range   Cholesterol 67 0 - 200 mg/dL   Triglycerides 59.9 0.0 - 149.0 mg/dL   HDL 69.69 (L) >60.99 mg/dL   VLDL 8.0 0.0 - 59.9 mg/dL   LDL Cholesterol 29 0 - 99 mg/dL   Total CHOL/HDL Ratio 2    NonHDL 36.53     Assessment & Plan:   Problem List Items Addressed This Visit     Encounter for general adult medical examination with abnormal findings - Primary (Chronic)   Preventative protocols reviewed and updated unless pt declined. Discussed healthy diet and lifestyle.       Congenitally corrected transposition of great arteries   Dyspnea   Notes delayed dyspnea described as trouble with deep breaths sometimes after eating but  unsure what foods can trigger this. No hives/urticaria, tongue/throat/lip swelling, other anaphylaxis, or nausea with symptoms He saw allergist Dr Dasie back in 2021 with significant environmental allergies noted however I don't see where he completed allergy  testing.  He does sensitivity to eggs.  Offered food allergy  panel today.       Relevant Orders   Food Allergy  Profile   Systolic murmur   Chronic, stable. Regularly sees cardiology.       Abnormal TSH   Update TSH,fT4      Relevant Orders   TSH (Completed)   T4, free (Completed)   Gallstones   Known gallstones but denies symptoms. Latest imaging showed multiple calcified gallstones, largest measuring 1.4cm in size. Reviewed options of no treatment as asxs vs gallbladder removal vs less preferred dissolution therapy. Reviewed surgical treatment is preferred to decrease risk of biliary colic or cholecystitis. He does not want to have surgery. He is interested in trial of gallstone dissolution therapy. Discussed risks of treatment, 505 risk of recurrence, ursodiol medication handout provided. Check baseline LFTs and if normal, consider trial.       Relevant Orders   Comprehensive metabolic panel with GFR (Completed)   Lipid panel (Completed)   Cyst of spleen   Last imaged 04/2021 3.9x5.9 cm at that time.  Will offer updated imaging with abd US .  Will see if I can add Echinococcus IgG to blood in lab r/o hydatid cyst.       Vitamin D  insufficiency   Update levels off regular replacement      Relevant Orders   VITAMIN D  25 Hydroxy (Vit-D Deficiency, Fractures) (Completed)   Other  Visit Diagnoses       Need for hepatitis C screening test       Relevant Orders   Hepatitis C antibody     Need for Tdap vaccination       Relevant Orders   Tdap vaccine greater than or equal to 7yo IM (Completed)        No orders of the defined types were placed in this encounter.   Orders Placed This Encounter  Procedures   Tdap  vaccine greater than or equal to 7yo IM   Comprehensive metabolic panel with GFR   TSH   T4, free   VITAMIN D  25 Hydroxy (Vit-D Deficiency, Fractures)   Food Allergy  Profile   Lipid panel   Hepatitis C antibody    Patient Instructions  Tdap today  Labs today  including food allergy  testing.  We will be in touch with results  Return as needed or in 1 yr for next physical.   Follow up plan: Return in about 1 year (around 06/18/2025) for annual exam, prior fasting for blood work.  Anton Blas, MD

## 2024-06-19 NOTE — Assessment & Plan Note (Addendum)
 Last imaged 04/2021 3.9x5.9 cm at that time.  Will offer updated imaging with abd US .  Will see if I can add Echinococcus IgG to blood in lab r/o hydatid cyst.

## 2024-06-19 NOTE — Assessment & Plan Note (Addendum)
 Notes delayed dyspnea described as trouble with deep breaths sometimes after eating but unsure what foods can trigger this. No hives/urticaria, tongue/throat/lip swelling, other anaphylaxis, or nausea with symptoms He saw allergist Dr Dasie back in 2021 with significant environmental allergies noted however I don't see where he completed allergy  testing.  He does sensitivity to eggs.  Offered food allergy  panel today.

## 2024-06-19 NOTE — Assessment & Plan Note (Signed)
 Chronic, stable. Regularly sees cardiology.

## 2024-06-19 NOTE — Assessment & Plan Note (Signed)
 Update levels off regular replacement.

## 2024-06-19 NOTE — Assessment & Plan Note (Signed)
Update TSH, fT4.  

## 2024-06-19 NOTE — Assessment & Plan Note (Addendum)
 Known gallstones but denies symptoms. Latest imaging showed multiple calcified gallstones, largest measuring 1.4cm in size. Reviewed options of no treatment as asxs vs gallbladder removal vs less preferred dissolution therapy. Reviewed surgical treatment is preferred to decrease risk of biliary colic or cholecystitis. He does not want to have surgery. He is interested in trial of gallstone dissolution therapy. Discussed risks of treatment, 505 risk of recurrence, ursodiol medication handout provided. Check baseline LFTs and if normal, consider trial.

## 2024-06-22 LAB — FOOD ALLERGY PROFILE

## 2024-06-22 LAB — HEPATITIS C ANTIBODY: Hepatitis C Ab: NONREACTIVE

## 2024-06-22 LAB — INTERPRETATION:

## 2024-06-28 ENCOUNTER — Ambulatory Visit: Payer: Self-pay | Admitting: Family Medicine

## 2024-06-28 DIAGNOSIS — K8020 Calculus of gallbladder without cholecystitis without obstruction: Secondary | ICD-10-CM

## 2024-06-28 DIAGNOSIS — D734 Cyst of spleen: Secondary | ICD-10-CM

## 2024-07-01 MED ORDER — URSODIOL 300 MG PO CAPS: 300.0000 mg | ORAL_CAPSULE | Freq: Two times a day (BID) | ORAL | 6 refills | Status: AC

## 2024-07-02 ENCOUNTER — Ambulatory Visit: Admitting: Nurse Practitioner

## 2024-07-22 ENCOUNTER — Ambulatory Visit: Attending: Nurse Practitioner | Admitting: Physician Assistant

## 2024-07-22 ENCOUNTER — Encounter: Payer: Self-pay | Admitting: Nurse Practitioner

## 2024-07-22 VITALS — BP 108/60 | HR 68 | Ht 68.0 in | Wt 156.4 lb

## 2024-07-22 DIAGNOSIS — I77810 Thoracic aortic ectasia: Secondary | ICD-10-CM | POA: Diagnosis not present

## 2024-07-22 DIAGNOSIS — Q205 Discordant atrioventricular connection: Secondary | ICD-10-CM

## 2024-07-22 DIAGNOSIS — R011 Cardiac murmur, unspecified: Secondary | ICD-10-CM

## 2024-07-22 NOTE — Patient Instructions (Signed)
 Medication Instructions:  No changes *If you need a refill on your cardiac medications before your next appointment, please call your pharmacy*  Lab Work: Your provider would like for you to have the following labs today: BMET for the CT  If you have labs (blood work) drawn today and your tests are completely normal, you will receive your results only by: MyChart Message (if you have MyChart) OR A paper copy in the mail If you have any lab test that is abnormal or we need to change your treatment, we will call you to review the results.  Testing/Procedures: CT Angiography (CTA) chest/aorta, is a special type of CT scan that uses a computer to produce multi-dimensional views of major blood vessels throughout the body. In CT angiography, a contrast material is injected through an IV to help visualize the blood vessels  Nothing to eat or drink 4 hours prior to test  [facility] [address]  [place], [place] [address]   Follow-Up: At [facility], you and your health needs are our priority.  As part of our continuing mission to provide you with exceptional heart care, our providers are all part of one team.  This team includes your primary Cardiologist (physician) and Advanced Practice Providers or APPs (Physician Assistants and Nurse Practitioners) who all work together to provide you with the care you need, when you need it.  Your next appointment:   12 month(s)  Provider:   Timothy Gollan, MD    We recommend signing up for the patient portal called MyChart.  Sign up information is provided on this After Visit Summary.  MyChart is used to connect with patients for Virtual Visits (Telemedicine).  Patients are able to view lab/test results, encounter notes, upcoming appointments, etc.  Non-urgent messages can be sent to your provider as well.   To learn more about what you can do with MyChart, go to [email or web address].

## 2024-07-22 NOTE — Progress Notes (Signed)
 Cardiology Office Note    Date:  07/22/2024   ID:  Danny Russo, DOB 10/09/1991, MRN [id number]  PCP:  Rilla Baller, MD  Cardiologist:  Evalene Lunger, MD  Electrophysiologist:  None   Chief Complaint: Follow up  History of Present Illness:   Danny Russo is a 33 y.o. 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     Echo in 2017 showed EF 60 to 65%, no RWMA, mildly dilated aortic root measuring 3.9 cm, and mild MR.  CTA in 2017 showed mild ectasia of the ascending aorta at 3.8 cm along with repair of transposition of the great vessels.  Echo 2018 showed EF 55 to 60%, mildly dilated aortic root at 3.8 cm, mildly dilated ascending aorta, mildly dilated RV with mildly reduced RV systolic function.  He was seen in the ED 01/2020 with 4-day history of left-sided chest tightness which radiated to his back with occasional shortness of breath.  Symptoms were felt to be related to food consumption and improved with nebulizer therapy.  He was discharged with albuterol .  Seen by primary cardiologist 03/2020 and ordered echocardiogram which showed EF 60 to 65%, no RWMA, mildly reduced RV systolic function with mildly enlarged RV, dilation of the aortic root measuring 41 mm.  Coronary CTA 07/2020 showed coronary calcium score of 0 with no evidence of CAD.  Echo 01/2021 showed EF 40 to 45% with global hypokinesis. He was seen in follow-up 06/2021 and denied chest pain and shortness of breath.  Patient had a brief episode of presyncope while getting lab work after this visit.  Coronary CTA and ZIO XT were ordered for further workup. Coronary CTA did not show evidence of CAD with EF 67%. Aortic root measured at 39 mm.    Patient was most recently  seen 08/2022 overall doing well from a cardiac perspective. He was without chest pain, shortness of breath, and palpitations. No further testing or medication changes were indicated at that time.   Patient presents today overall doing well from a cardiac perspective.  He was recently in the ED for gallstones.  He is working with his PCP to manage his symptoms.  He otherwise denies chest pain, shortness of breath, lightheadedness, dizziness, palpitations, and lower extremity swelling.  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 Filbert pediatric cardiology, no need for SBE ppx, now sees Dr Gollan regularly    Current Medications: No outpatient medications have been marked as taking for the 07/22/24 encounter (Appointment) with Vivienne Lonni Ingle, NP.    Allergies:   Patient has no known allergies.   Social History   Socioeconomic History   Marital status: Single    Spouse name: Not on file   Number of children: 0   Years of education: HS   Highest education level: Not on file  Occupational History   Not on file  Tobacco Use   Smoking status: Former    Current packs/day: 0.00    Average packs/day: 0.5 packs/day for 2.0 years (1.0 ttl pk-yrs)    Types: Cigarettes    Start date: 10/22/2012    Quit date: 10/22/2014    Years since quitting: 9.7   Smokeless tobacco: Former    Types: Snuff  Vaping Use   Vaping status: Never Used  Substance and Sexual Activity   Alcohol use: Yes    Alcohol/week: 0.0 standard drinks of alcohol    Comment: Occasional   Drug use: No    Types:  Marijuana    Comment: MJ-(trying to stop 12/07/14)   Sexual activity: Not on file  Other Topics Concern   Not on file  Social History Narrative   Lives alone    Mother lives in [place], Father lives in [place]   Occ: Patent examiner - wears mask    Activity: enjoys walking and biking   Diet: good water, overall healthy changes   Social Drivers of Corporate investment banker Strain: Not on file  Food Insecurity: Not on file  Transportation Needs: Not on file  Physical Activity: Not on file  Stress: Not on file  Social Connections: Not on file     Family History:  The patient's family history includes Cancer in his maternal uncle; Coronary artery disease (age of onset: 59) in his maternal uncle; Diabetes in his maternal grandmother. There is no history of Stroke.  ROS:   12-point review of systems is negative unless otherwise noted in the HPI.  EKGs/Other Studies Reviewed:    Studies reviewed were summarized above. The additional studies were reviewed today:  06/2021 Coronary CTA 1. S/p arterial switch with expected post-surgical changes. 2. 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 Coronary anomalies are unchanged. 3. Coronary calcium score of 0. No evidence of coronary artery disease. 4. Normal left ventricular size with normal function, EF 67%. 5. Trileaflet aortic valve with normal leaflet motion and no calcification. 6. Aortic root measures up to 39 mm. 7. Ascending aorta measures up to 40 mm at the STJ.  Unchanged. 8. Diminutive right and left pulmonary arteries with measurements above. No change from prior. 9. Overall, study is unchanged from prior.  05/2021 Echo complete 1. Left ventricular ejection fraction, by estimation, is 40 to 45%. The  left ventricle has mild to moderately decreased function. The left  ventricle demonstrates global hypokinesis. Left  ventricular diastolic  parameters were normal. The average left  ventricular global longitudinal strain is -16.4 %. The global longitudinal  strain is normal.   2. Right ventricular systolic function is normal. The right ventricular  size is mildly enlarged.   3. Right atrial size was mildly dilated.   4. The mitral valve is normal in structure. No evidence of mitral valve  regurgitation.   5. The aortic valve was not well visualized. Aortic valve regurgitation  is not visualized.   6. The inferior vena cava is normal in size with <50% respiratory  variability, suggesting right atrial pressure of 8 mmHg.    EKG:  EKG personally reviewed by me today    PHYSICAL EXAM:    VS:  There were no vitals taken for this visit.  BMI: There is no height or weight on file to calculate BMI.  GEN: Well  nourished, well developed in no acute distress NECK: No JVD; No carotid bruits CARDIAC: RRR, 3/6 systolic murmur, no rubs or gallops RESPIRATORY:  Clear to auscultation without rales, wheezing or rhonchi  ABDOMEN: Soft, non-tender, non-distended EXTREMITIES: No edema; No deformity  Wt Readings from Last 3 Encounters:  06/18/24 155 lb 6.4 oz (70.5 kg)  05/19/24 155 lb (70.3 kg)  01/24/23 164 lb 6 oz (74.6 kg)       ASSESSMENT & PLAN:   Transposition of the great arteries, corrected Systolic murmur - Echo in 2022 showed mildly reduced EF.  Cardiac CTA 06/2021 with EF normalized at 67%.  No symptoms of chest pain or shortness of breath.  Murmur is stable.  Aortic root dilation Dilation of ascending aorta - Most recent imaging 06/2021 showed aortic root measuring 39 mm with ascending aorta measuring up to 40 mm at the STJ.  Will get updated CTA today.  Recommend annual imaging moving forward.    Disposition: Update CTA chest/aorta. F/u with Dr. Gollan or an APP in 1 year.   Medication Adjustments/Labs and Tests Ordered: Current medicines are reviewed at length with the patient today.   Concerns regarding medicines are outlined above. Medication changes, Labs and Tests ordered today are summarized above and listed in the Patient Instructions accessible in Encounters.   Bonney Lesley Maffucci, PA-C 07/22/2024 7:54 AM     [facility] [place] [address] [place], [place] [address] [phone]

## 2024-07-28 ENCOUNTER — Ambulatory Visit
Admission: RE | Admit: 2024-07-28 | Discharge: 2024-07-28 | Disposition: A | Discharge status: Home / Self Care | Source: Ambulatory Visit | Attending: Physician Assistant | Admitting: Physician Assistant

## 2024-07-28 DIAGNOSIS — I77810 Thoracic aortic ectasia: Secondary | ICD-10-CM | POA: Insufficient documentation

## 2024-07-28 MED ORDER — IOHEXOL 350 MG/ML SOLN
75.0000 mL | Freq: Once | INTRAVENOUS | Status: AC | PRN
  Administered 2024-07-28: 75 mL via INTRAVENOUS

## 2024-08-05 ENCOUNTER — Ambulatory Visit: Payer: Self-pay | Admitting: Physician Assistant

## 2024-08-05 NOTE — Progress Notes (Signed)
 Last read by Taevyn A Issa at 7:35AM on 08/05/2024.

## 2024-12-15 ENCOUNTER — Ambulatory Visit: Admitting: Family Medicine

## 2024-12-15 ENCOUNTER — Encounter: Payer: Self-pay | Admitting: Family Medicine

## 2024-12-15 VITALS — BP 120/64 | HR 63 | Temp 98.6°F | Ht 68.0 in | Wt 164.0 lb

## 2024-12-15 DIAGNOSIS — E559 Vitamin D deficiency, unspecified: Secondary | ICD-10-CM | POA: Diagnosis not present

## 2024-12-15 DIAGNOSIS — R5383 Other fatigue: Secondary | ICD-10-CM | POA: Diagnosis not present

## 2024-12-15 DIAGNOSIS — M542 Cervicalgia: Secondary | ICD-10-CM

## 2024-12-15 LAB — IBC + FERRITIN
Ferritin: 157.5 ng/mL (ref 22.0–322.0)
Iron: 75 ug/dL (ref 42–165)
Saturation Ratios: 22.8 % (ref 20.0–50.0)
TIBC: 329 ug/dL (ref 250.0–450.0)
Transferrin: 235 mg/dL (ref 212.0–360.0)

## 2024-12-15 LAB — TSH: TSH: 2.31 u[IU]/mL (ref 0.35–5.50)

## 2024-12-15 LAB — T3, FREE: T3, Free: 3.2 pg/mL (ref 2.3–4.2)

## 2024-12-15 LAB — T4, FREE: Free T4: 0.85 ng/dL (ref 0.60–1.60)

## 2024-12-15 LAB — VITAMIN D 25 HYDROXY (VIT D DEFICIENCY, FRACTURES): VITD: 23.55 ng/mL — ABNORMAL LOW (ref 30.00–100.00)

## 2024-12-15 NOTE — Progress Notes (Signed)
 "   Patient ID: Buren A Mantey, male    DOB: 16-Jul-1991, 34 y.o.   MRN: [id number]  This visit was conducted in person.  BP 120/64   Pulse 63   Temp 98.6 F (37 C) (Oral)   Ht 5' 8 (1.727 m)   Wt 164 lb (74.4 kg)   SpO2 99%   BMI 24.94 kg/m    CC:  Chief Complaint  Patient presents with   Sore Throat    Right side x 1 week trouble swallowing. Denies any fever     Subjective:   HPI: DEMONTREZ RINDFLEISCH is a 34 y.o. male presenting on 12/15/2024 for Sore Throat (Right side x 1 week trouble swallowing. Denies any fever )    1 week of sore throat, on right side.  Initial fever.  Mild  cough.. productive, white and yellow.  No ear pain. No hearing loss.  Nasal congestion.  No face pain.  No fever.  No SOB, no wheeze.   Sick contacts: none  He has tried  ibuprofen  Has not been having much iodized salt... notes after spinach and collards.  He is worried this is a thyroid  issue.  No change in size of thyroid .   He has had intermittent issues  in past.. in past several years. Has noted with walnuts in past.    No family history of thyroid .     He has been feeling tired off and on.  Relevant past medical, surgical, family and social history reviewed and updated as indicated. Interim medical history since our last visit reviewed. Allergies and medications reviewed and updated. Outpatient Medications Prior to Visit  Medication Sig Dispense Refill   albuterol  (VENTOLIN  HFA) 108 (90 Base) MCG/ACT inhaler Inhale 1 puff into the lungs every 6 (six) hours as needed.     Cholecalciferol 50 MCG (2000 UT) TABS Take 1,000 Units by mouth daily.     cyanocobalamin  (VITAMIN B12) 1000 MCG tablet Take 1,000 mcg by mouth daily.     ursodiol  (ACTIGALL ) 300 MG capsule Take 1 capsule (300 mg total) by mouth 2 (two) times daily. (Patient not taking: Reported on 07/22/2024) 60 capsule 6   No facility-administered medications prior to visit.     Per HPI unless specifically indicated in ROS  section below Review of Systems  Constitutional:  Positive for fatigue. Negative for fever.  HENT:  Negative for ear pain.   Eyes:  Negative for pain.  Respiratory:  Negative for cough and shortness of breath.   Cardiovascular:  Negative for chest pain, palpitations and leg swelling.  Gastrointestinal:  Negative for abdominal pain.  Genitourinary:  Negative for dysuria.  Musculoskeletal:  Negative for arthralgias.  Neurological:  Negative for syncope, light-headedness and headaches.  Psychiatric/Behavioral:  Negative for dysphoric mood.    Objective:  BP 120/64   Pulse 63   Temp 98.6 F (37 C) (Oral)   Ht 5' 8 (1.727 m)   Wt 164 lb (74.4 kg)   SpO2 99%   BMI 24.94 kg/m   Wt Readings from Last 3 Encounters:  12/15/24 164 lb (74.4 kg)  07/22/24 156 lb 6 oz (70.9 kg)  06/18/24 155 lb 6.4 oz (70.5 kg)      Physical Exam Constitutional:      Appearance: He is well-developed.  HENT:     Head: Normocephalic.     Right Ear: Hearing normal.     Left Ear: Hearing normal.     Nose: Nose normal.  Neck:  Thyroid : No thyroid  mass or thyromegaly.     Vascular: No carotid bruit.     Trachea: Trachea normal.  Cardiovascular:     Rate and Rhythm: Normal rate and regular rhythm.     Pulses: Normal pulses.     Heart sounds: Heart sounds not distant. No murmur heard.    No friction rub. No gallop.     Comments: No peripheral edema Pulmonary:     Effort: Pulmonary effort is normal. No respiratory distress.     Breath sounds: Normal breath sounds.  Skin:    General: Skin is warm and dry.     Findings: No rash.  Psychiatric:        Speech: Speech normal.        Behavior: Behavior normal.        Thought Content: Thought content normal.       Results for orders placed or performed in visit on 12/15/24  TSH   Collection Time: 12/15/24  1:17 PM  Result Value Ref Range   TSH 2.31 0.35 - 5.50 uIU/mL  T4, free   Collection Time: 12/15/24  1:17 PM  Result Value Ref Range    Free T4 0.85 0.60 - 1.60 ng/dL  T3, free   Collection Time: 12/15/24  1:17 PM  Result Value Ref Range   T3, Free 3.2 2.3 - 4.2 pg/mL  VITAMIN D  25 Hydroxy (Vit-D Deficiency, Fractures)   Collection Time: 12/15/24  1:17 PM  Result Value Ref Range   VITD 23.55 (L) 30.00 - 100.00 ng/mL  IBC + Ferritin   Collection Time: 12/15/24  1:17 PM  Result Value Ref Range   Iron 75 42 - 165 ug/dL   Transferrin 764.9 787.9 - 360.0 mg/dL   Saturation Ratios 77.1 20.0 - 50.0 %   Ferritin 157.5 22.0 - 322.0 ng/mL   TIBC 329.0 250.0 - 450.0 mcg/dL    Assessment and Plan  Anterior neck pain Assessment & Plan: Acute anterior neck pain.  Per patient although he has some cold symptoms does not feel like typical sore throat with cold.  Strep throat testing and negative.  He is concerned about thyroid  disruption given he does not get much iodine in his foods. We will check thyroid  blood testing as well as a thyroid  ultrasound.  He also notes significant issues after particular foods so we discussed briefly allergy  testing in the future.  Orders: -     TSH -     T4, free -     T3, free -     US  THYROID ; Future  Vitamin D  insufficiency -     VITAMIN D  25 Hydroxy (Vit-D Deficiency, Fractures)  Other fatigue Assessment & Plan: Chronic, patient wishes to check iron levels and vitamin D  given past insufficiency.  Orders: -     CBC with Differential/Platelet; Future -     IBC + Ferritin    No follow-ups on file.   Greig Ring, MD  "

## 2024-12-16 ENCOUNTER — Telehealth: Payer: Self-pay | Admitting: Family Medicine

## 2024-12-16 ENCOUNTER — Ambulatory Visit: Payer: Self-pay | Admitting: Family Medicine

## 2024-12-16 ENCOUNTER — Other Ambulatory Visit: Payer: Self-pay | Admitting: Family Medicine

## 2024-12-16 MED ORDER — VITAMIN D3 1.25 MG (50000 UT) PO CAPS: 1.0000 | ORAL_CAPSULE | ORAL | 0 refills | Status: AC

## 2024-12-16 NOTE — Telephone Encounter (Signed)
 Copied from CRM [id number]. Topic: Referral - Question >> Dec 16, 2024  8:49 AM Larissa RAMAN wrote: Reason for CRM: Patient states he is not in network with [facility]. He states he has called multiple places and can not find anyone who accepts his insurance, [employer]. He is requesting assistance with finding a provider that accepts his insurance. Do you accept this ins?

## 2024-12-16 NOTE — Telephone Encounter (Signed)
 I don't know this. Will forward to [facility]. Thanks

## 2024-12-17 NOTE — Telephone Encounter (Signed)
 Spoke to patient, [employer] is not in network with [facility], patient lives in [place] code [address] and is requesting a location within 30 miles of his home that he could get the thyroid  ultrasound at that accepts his insurance

## 2024-12-18 DIAGNOSIS — M542 Cervicalgia: Secondary | ICD-10-CM | POA: Insufficient documentation

## 2024-12-18 NOTE — Assessment & Plan Note (Signed)
 Acute anterior neck pain.  Per patient although he has some cold symptoms does not feel like typical sore throat with cold.  Strep throat testing and negative.  He is concerned about thyroid  disruption given he does not get much iodine in his foods. We will check thyroid  blood testing as well as a thyroid  ultrasound.  He also notes significant issues after particular foods so we discussed briefly allergy  testing in the future.

## 2024-12-18 NOTE — Assessment & Plan Note (Signed)
 Chronic, patient wishes to check iron levels and vitamin D  given past insufficiency.

## 2024-12-22 ENCOUNTER — Other Ambulatory Visit: Payer: Self-pay | Admitting: Family Medicine

## 2024-12-22 DIAGNOSIS — M542 Cervicalgia: Secondary | ICD-10-CM

## 2024-12-22 DIAGNOSIS — E049 Nontoxic goiter, unspecified: Secondary | ICD-10-CM

## 2024-12-22 DIAGNOSIS — E559 Vitamin D deficiency, unspecified: Secondary | ICD-10-CM

## 2024-12-22 DIAGNOSIS — R5383 Other fatigue: Secondary | ICD-10-CM

## 2024-12-23 ENCOUNTER — Ambulatory Visit (HOSPITAL_COMMUNITY)
Admission: RE | Admit: 2024-12-23 | Discharge: 2024-12-23 | Disposition: A | Discharge status: Home / Self Care | Source: Ambulatory Visit | Attending: Family Medicine | Admitting: Family Medicine

## 2024-12-23 DIAGNOSIS — E049 Nontoxic goiter, unspecified: Secondary | ICD-10-CM | POA: Diagnosis present

## 2024-12-23 DIAGNOSIS — M542 Cervicalgia: Secondary | ICD-10-CM | POA: Diagnosis present

## 2024-12-24 ENCOUNTER — Ambulatory Visit: Payer: Self-pay | Admitting: Family Medicine
